# Patient Record
Sex: Female | Born: 1970 | Race: Black or African American | Hispanic: No | Marital: Single | Smoking: Current some day smoker
Health system: Southern US, Community
[De-identification: ages and names within clinical notes are randomized; demographics above are authoritative.]

## PROBLEM LIST (undated history)

## (undated) DIAGNOSIS — I10 Essential (primary) hypertension: Secondary | ICD-10-CM

## (undated) DIAGNOSIS — K219 Gastro-esophageal reflux disease without esophagitis: Secondary | ICD-10-CM

## (undated) DIAGNOSIS — D649 Anemia, unspecified: Secondary | ICD-10-CM

## (undated) DIAGNOSIS — R519 Headache, unspecified: Secondary | ICD-10-CM

## (undated) DIAGNOSIS — Z889 Allergy status to unspecified drugs, medicaments and biological substances status: Secondary | ICD-10-CM

## (undated) DIAGNOSIS — E785 Hyperlipidemia, unspecified: Secondary | ICD-10-CM

## (undated) DIAGNOSIS — J45909 Unspecified asthma, uncomplicated: Secondary | ICD-10-CM

## (undated) HISTORY — PX: WISDOM TOOTH EXTRACTION: SHX21

## (undated) HISTORY — PX: TUBAL LIGATION: SHX77

## (undated) HISTORY — DX: Hyperlipidemia, unspecified: E78.5

## (undated) HISTORY — PX: TONSILLECTOMY: SUR1361

---

## 1997-10-14 ENCOUNTER — Emergency Department (HOSPITAL_COMMUNITY): Admission: EM | Admit: 1997-10-14 | Discharge: 1997-10-14 | Payer: Self-pay | Admitting: Emergency Medicine

## 1999-10-02 ENCOUNTER — Emergency Department (HOSPITAL_COMMUNITY): Admission: EM | Admit: 1999-10-02 | Discharge: 1999-10-02 | Payer: Self-pay | Admitting: Emergency Medicine

## 2001-12-13 ENCOUNTER — Emergency Department (HOSPITAL_COMMUNITY): Admission: EM | Admit: 2001-12-13 | Discharge: 2001-12-13 | Payer: Self-pay | Admitting: Emergency Medicine

## 2001-12-13 ENCOUNTER — Encounter: Payer: Self-pay | Admitting: Emergency Medicine

## 2002-01-28 ENCOUNTER — Emergency Department (HOSPITAL_COMMUNITY): Admission: EM | Admit: 2002-01-28 | Discharge: 2002-01-29 | Payer: Self-pay | Admitting: Emergency Medicine

## 2002-01-28 ENCOUNTER — Encounter: Payer: Self-pay | Admitting: Emergency Medicine

## 2002-12-22 ENCOUNTER — Emergency Department (HOSPITAL_COMMUNITY): Admission: EM | Admit: 2002-12-22 | Discharge: 2002-12-22 | Payer: Self-pay

## 2003-02-07 ENCOUNTER — Emergency Department (HOSPITAL_COMMUNITY): Admission: EM | Admit: 2003-02-07 | Discharge: 2003-02-07 | Payer: Self-pay | Admitting: Emergency Medicine

## 2003-03-16 ENCOUNTER — Inpatient Hospital Stay (HOSPITAL_COMMUNITY): Admission: AD | Admit: 2003-03-16 | Discharge: 2003-03-16 | Payer: Self-pay | Admitting: *Deleted

## 2004-01-23 ENCOUNTER — Emergency Department (HOSPITAL_COMMUNITY): Admission: EM | Admit: 2004-01-23 | Discharge: 2004-01-23 | Payer: Self-pay | Admitting: Emergency Medicine

## 2005-08-17 ENCOUNTER — Emergency Department (HOSPITAL_COMMUNITY): Admission: EM | Admit: 2005-08-17 | Discharge: 2005-08-17 | Payer: Self-pay | Admitting: *Deleted

## 2006-05-17 ENCOUNTER — Emergency Department (HOSPITAL_COMMUNITY): Admission: EM | Admit: 2006-05-17 | Discharge: 2006-05-17 | Payer: Self-pay | Admitting: Emergency Medicine

## 2006-05-21 ENCOUNTER — Emergency Department (HOSPITAL_COMMUNITY): Admission: EM | Admit: 2006-05-21 | Discharge: 2006-05-21 | Payer: Self-pay | Admitting: Emergency Medicine

## 2006-07-13 ENCOUNTER — Emergency Department (HOSPITAL_COMMUNITY): Admission: EM | Admit: 2006-07-13 | Discharge: 2006-07-13 | Payer: Self-pay | Admitting: Emergency Medicine

## 2007-05-23 ENCOUNTER — Emergency Department (HOSPITAL_COMMUNITY): Admission: EM | Admit: 2007-05-23 | Discharge: 2007-05-23 | Payer: Self-pay | Admitting: Family Medicine

## 2009-06-24 ENCOUNTER — Inpatient Hospital Stay (HOSPITAL_COMMUNITY): Admission: AD | Admit: 2009-06-24 | Discharge: 2009-06-24 | Payer: Self-pay | Admitting: Obstetrics & Gynecology

## 2009-08-05 ENCOUNTER — Ambulatory Visit: Payer: Self-pay | Admitting: Obstetrics & Gynecology

## 2009-08-05 ENCOUNTER — Other Ambulatory Visit: Admission: RE | Admit: 2009-08-05 | Discharge: 2009-08-05 | Payer: Self-pay | Admitting: Obstetrics & Gynecology

## 2009-08-05 LAB — CONVERTED CEMR LAB
HCT: 29.9 % — ABNORMAL LOW (ref 36.0–46.0)
Hemoglobin: 7.9 g/dL — ABNORMAL LOW (ref 12.0–15.0)
MCHC: 26.4 g/dL — ABNORMAL LOW (ref 30.0–36.0)
MCV: 67.8 fL — ABNORMAL LOW (ref 78.0–100.0)
Platelets: 349 10*3/uL (ref 150–400)
RBC: 4.41 M/uL (ref 3.87–5.11)
RDW: 19.4 % — ABNORMAL HIGH (ref 11.5–15.5)
WBC: 8.4 10*3/uL (ref 4.0–10.5)

## 2009-08-26 ENCOUNTER — Ambulatory Visit: Payer: Self-pay | Admitting: Obstetrics and Gynecology

## 2009-11-10 ENCOUNTER — Ambulatory Visit: Payer: Self-pay | Admitting: Obstetrics & Gynecology

## 2009-11-11 ENCOUNTER — Encounter: Payer: Self-pay | Admitting: Obstetrics & Gynecology

## 2009-11-11 LAB — CONVERTED CEMR LAB
HCT: 27.1 % — ABNORMAL LOW (ref 36.0–46.0)
Hemoglobin: 7.4 g/dL — ABNORMAL LOW (ref 12.0–15.0)
MCHC: 27.3 g/dL — ABNORMAL LOW (ref 30.0–36.0)
MCV: 66.7 fL — ABNORMAL LOW (ref 78.0–100.0)
Platelets: 322 10*3/uL (ref 150–400)
RBC: 4.06 M/uL (ref 3.87–5.11)
RDW: 17.9 % — ABNORMAL HIGH (ref 11.5–15.5)
WBC: 6.6 10*3/uL (ref 4.0–10.5)

## 2009-12-21 ENCOUNTER — Ambulatory Visit: Payer: Self-pay | Admitting: Obstetrics and Gynecology

## 2009-12-21 LAB — CONVERTED CEMR LAB
HCT: 33.5 % — ABNORMAL LOW (ref 36.0–46.0)
Hemoglobin: 9.5 g/dL — ABNORMAL LOW (ref 12.0–15.0)
MCHC: 28.4 g/dL — ABNORMAL LOW (ref 30.0–36.0)
MCV: 71.4 fL — ABNORMAL LOW (ref 78.0–100.0)
Platelets: 320 10*3/uL (ref 150–400)
RBC: 4.69 M/uL (ref 3.87–5.11)
RDW: 24.1 % — ABNORMAL HIGH (ref 11.5–15.5)
WBC: 7.8 10*3/uL (ref 4.0–10.5)

## 2009-12-22 ENCOUNTER — Ambulatory Visit: Payer: Self-pay | Admitting: Obstetrics and Gynecology

## 2009-12-28 ENCOUNTER — Ambulatory Visit: Payer: Self-pay | Admitting: Obstetrics and Gynecology

## 2010-01-11 ENCOUNTER — Ambulatory Visit: Payer: Self-pay | Admitting: Obstetrics & Gynecology

## 2010-02-10 ENCOUNTER — Ambulatory Visit: Payer: Self-pay | Admitting: Obstetrics and Gynecology

## 2010-03-08 ENCOUNTER — Ambulatory Visit
Admission: RE | Admit: 2010-03-08 | Discharge: 2010-03-08 | Payer: Self-pay | Source: Home / Self Care | Attending: Obstetrics and Gynecology | Admitting: Obstetrics and Gynecology

## 2010-03-22 ENCOUNTER — Ambulatory Visit: Payer: Self-pay | Admitting: Obstetrics & Gynecology

## 2010-03-22 ENCOUNTER — Ambulatory Visit: Admit: 2010-03-22 | Payer: Self-pay | Admitting: Obstetrics and Gynecology

## 2010-05-07 LAB — POCT PREGNANCY, URINE: Preg Test, Ur: NEGATIVE

## 2010-05-09 LAB — CBC
HCT: 24.2 % — ABNORMAL LOW (ref 36.0–46.0)
Hemoglobin: 7.4 g/dL — ABNORMAL LOW (ref 12.0–15.0)
MCHC: 30.4 g/dL (ref 30.0–36.0)
MCV: 63.3 fL — ABNORMAL LOW (ref 78.0–100.0)
Platelets: 343 10*3/uL (ref 150–400)
RBC: 3.83 MIL/uL — ABNORMAL LOW (ref 3.87–5.11)
RDW: 18.3 % — ABNORMAL HIGH (ref 11.5–15.5)
WBC: 7.1 10*3/uL (ref 4.0–10.5)

## 2010-05-09 LAB — URINALYSIS, ROUTINE W REFLEX MICROSCOPIC
Bilirubin Urine: NEGATIVE
Glucose, UA: NEGATIVE mg/dL
Hgb urine dipstick: NEGATIVE
Ketones, ur: NEGATIVE mg/dL
Nitrite: NEGATIVE
Protein, ur: NEGATIVE mg/dL
Specific Gravity, Urine: >1.03 — ABNORMAL HIGH (ref 1.005–1.030)
Urobilinogen, UA: 0.2 mg/dL (ref 0.0–1.0)
pH: 6 (ref 5.0–8.0)

## 2010-05-09 LAB — WET PREP, GENITAL
Trich, Wet Prep: NONE SEEN
Yeast Wet Prep HPF POC: NONE SEEN

## 2010-05-09 LAB — POCT PREGNANCY, URINE: Preg Test, Ur: NEGATIVE

## 2010-05-09 LAB — GC/CHLAMYDIA PROBE AMP, GENITAL
Chlamydia, DNA Probe: NEGATIVE
GC Probe Amp, Genital: NEGATIVE

## 2010-06-08 ENCOUNTER — Other Ambulatory Visit: Payer: Self-pay | Admitting: Physician Assistant

## 2010-06-08 ENCOUNTER — Ambulatory Visit: Payer: Self-pay | Admitting: Physician Assistant

## 2010-06-08 DIAGNOSIS — N938 Other specified abnormal uterine and vaginal bleeding: Secondary | ICD-10-CM

## 2010-06-08 DIAGNOSIS — N925 Other specified irregular menstruation: Secondary | ICD-10-CM

## 2010-06-08 DIAGNOSIS — N949 Unspecified condition associated with female genital organs and menstrual cycle: Secondary | ICD-10-CM

## 2010-06-08 DIAGNOSIS — Z30430 Encounter for insertion of intrauterine contraceptive device: Secondary | ICD-10-CM

## 2010-06-08 LAB — POCT PREGNANCY, URINE: Preg Test, Ur: NEGATIVE

## 2010-06-09 NOTE — Group Therapy Note (Signed)
NAMESUMEYA, YONTZ              ACCOUNT NO.:  [id number]  MEDICAL RECORD NO.:  [id number]           PATIENT TYPE:  A  LOCATION:  [facility]                   FACILITY:  [facility]  PHYSICIAN:  Maylon Cos, CNM    DATE OF BIRTH:  1970-06-10  DATE OF SERVICE:  06/08/2010                                 CLINIC NOTE  REASON FOR TODAY'S VISIT:  Mirena IUD insertion.  HISTORY OF PRESENT ILLNESS:  The patient is a 40 year old single black female.  She is a gravida 7, para 4-0-3-3.  She presents today for Mirena IUD insertion for dysfunctional uterine bleeding after she was dissatisfied with Depo-Lupron.  After informed consent and time out, urine pregnancy test was negative.  The patient was placed in lithotomy position.  Graves speculum was used to visualize the paracervix.  Cervix was cleansed 3 times using Betadine solution.  Tenaculum was placed at 1 o'clock on the cervix and was used to measure an 8 cm uterine diameter. Mirena IUD was calibrated to the same and placed in the uterine cavity. Mirena IUD strings were trimmed to approximately 1 cm below the external os.  Tenaculum was removed from the cervix and bleeding was remained hemostasis using pressure with a Fox swab.  Remaining bleeding was cleared with Fox swab.  Speculum was removed from the vagina.  The patient tolerated the procedure well.  The patient was post-medicated with 100 mg of ibuprofen.  ASSESSMENT: 1. Dysfunctional uterine bleeding. 2. Mirena intrauterine contraceptive device placement successful     without complication.  FOLLOWUP PLAN:  The patient should follow up in 5-6 weeks for Mirena IUD string check or p.r.n. problems.          ______________________________ Maylon Cos, CNM    SS/MEDQ  D:  06/08/2010  T:  06/09/2010  Job:  [id number]

## 2010-07-07 ENCOUNTER — Ambulatory Visit: Payer: Self-pay | Admitting: Physician Assistant

## 2010-07-07 DIAGNOSIS — Z30431 Encounter for routine checking of intrauterine contraceptive device: Secondary | ICD-10-CM

## 2010-07-07 DIAGNOSIS — N939 Abnormal uterine and vaginal bleeding, unspecified: Secondary | ICD-10-CM

## 2010-07-07 DIAGNOSIS — D259 Leiomyoma of uterus, unspecified: Secondary | ICD-10-CM

## 2010-07-07 DIAGNOSIS — N926 Irregular menstruation, unspecified: Secondary | ICD-10-CM

## 2011-05-30 ENCOUNTER — Ambulatory Visit: Payer: Self-pay | Admitting: Physician Assistant

## 2011-08-22 ENCOUNTER — Emergency Department (HOSPITAL_COMMUNITY)
Admission: EM | Admit: 2011-08-22 | Discharge: 2011-08-23 | Disposition: A | Discharge status: Home / Self Care | Payer: Self-pay | Attending: Emergency Medicine | Admitting: Emergency Medicine

## 2011-08-22 ENCOUNTER — Emergency Department (HOSPITAL_COMMUNITY): Payer: Self-pay

## 2011-08-22 ENCOUNTER — Encounter (HOSPITAL_COMMUNITY): Payer: Self-pay | Admitting: *Deleted

## 2011-08-22 DIAGNOSIS — J45909 Unspecified asthma, uncomplicated: Secondary | ICD-10-CM | POA: Insufficient documentation

## 2011-08-22 DIAGNOSIS — R0602 Shortness of breath: Secondary | ICD-10-CM | POA: Insufficient documentation

## 2011-08-22 DIAGNOSIS — R110 Nausea: Secondary | ICD-10-CM | POA: Insufficient documentation

## 2011-08-22 DIAGNOSIS — K219 Gastro-esophageal reflux disease without esophagitis: Secondary | ICD-10-CM

## 2011-08-22 HISTORY — DX: Unspecified asthma, uncomplicated: J45.909

## 2011-08-22 MED ORDER — PANTOPRAZOLE SODIUM 40 MG PO TBEC
40.0000 mg | DELAYED_RELEASE_TABLET | Freq: Once | ORAL | Status: AC
  Administered 2011-08-22: 40 mg via ORAL
  Filled 2011-08-22: qty 1

## 2011-08-22 MED ORDER — GI COCKTAIL ~~LOC~~
30.0000 mL | Freq: Once | ORAL | Status: AC
  Administered 2011-08-22: 30 mL via ORAL
  Filled 2011-08-22: qty 30

## 2011-08-22 NOTE — ED Notes (Signed)
Pt st's she feels like something "is stuck" in between her shoulder blades.  Pt st's she can feel the pain in the front and the back, st's she doesn't have any chest pain but st's she has some SOB.  Pt st's the pain started one week ago Thursday.  Pt st's laying straight or bending over makes the pain better, st's the pain is constant.  Denies n/v, lightlessness, dizziness, blurred vision.  Pt st's she gets more SOB on exertion.

## 2011-08-23 LAB — CBC WITH DIFFERENTIAL/PLATELET
Basophils Absolute: 0 10*3/uL (ref 0.0–0.1)
Basophils Relative: 0 % (ref 0–1)
Eosinophils Absolute: 0.3 10*3/uL (ref 0.0–0.7)
Eosinophils Relative: 4 % (ref 0–5)
HCT: 37.5 % (ref 36.0–46.0)
Hemoglobin: 11.9 g/dL — ABNORMAL LOW (ref 12.0–15.0)
Lymphocytes Relative: 43 % (ref 12–46)
Lymphs Abs: 3.4 10*3/uL (ref 0.7–4.0)
MCH: 25.5 pg — ABNORMAL LOW (ref 26.0–34.0)
MCHC: 31.7 g/dL (ref 30.0–36.0)
MCV: 80.5 fL (ref 78.0–100.0)
Monocytes Absolute: 0.6 10*3/uL (ref 0.1–1.0)
Monocytes Relative: 7 % (ref 3–12)
Neutro Abs: 3.5 10*3/uL (ref 1.7–7.7)
Neutrophils Relative %: 46 % (ref 43–77)
Platelets: 287 10*3/uL (ref 150–400)
RBC: 4.66 MIL/uL (ref 3.87–5.11)
RDW: 17.6 % — ABNORMAL HIGH (ref 11.5–15.5)
WBC: 7.8 10*3/uL (ref 4.0–10.5)

## 2011-08-23 LAB — D-DIMER, QUANTITATIVE: D-Dimer, Quant: 0.28 ug/mL-FEU (ref 0.00–0.48)

## 2011-08-23 NOTE — ED Provider Notes (Signed)
History     CSN: [id number]  Arrival date & time 08/22/11  2244   First MD Initiated Contact with Patient 08/22/11 2316      Chief Complaint  Patient presents with  . Shortness of Breath    (Consider location/radiation/quality/duration/timing/severity/associated sxs/prior treatment) HPI Comments: Glenda Lee is a 41 y.o. Female who complains of a vague sensation of discomfort between her shoulder blades. It has been present for 1 week. In the last 24 hours. The discomfort. Has worsened and she has noticed that in the Center for chest with continued radiation to her back. She has nausea without vomiting. She denies fever, chills, cough, dizziness, weakness, or leg pain.  Patient is a 41 y.o. female presenting with shortness of breath. The history is provided by the patient.  Shortness of Breath  Associated symptoms include shortness of breath.    Past Medical History  Diagnosis Date  . Asthma     Past Surgical History  Procedure Date  . Tonsillectomy   . Abdominal hysterectomy     No family history on file.  History  Substance Use Topics  . Smoking status: Not on file  . Smokeless tobacco: Not on file  . Alcohol Use:      occasionally     OB History    Grav Para Term Preterm Abortions TAB SAB Ect Mult Living                  Review of Systems  Respiratory: Positive for shortness of breath.   All other systems reviewed and are negative.    Allergies  Review of patient's allergies indicates no known allergies.  Home Medications   Current Outpatient Rx  Name Route Sig Dispense Refill  . LEVONORGESTREL 20 MCG/24HR IU IUD Intrauterine 1 each by Intrauterine route once.      BP 125/85  Pulse 58  Temp 98.1 F (36.7 C) (Oral)  Resp 17  Ht 5' 7.5" (1.715 m)  Wt 265 lb (120.203 kg)  BMI 40.89 kg/m2  SpO2 100%  LMP 08/22/2011  Physical Exam  Nursing note and vitals reviewed. Constitutional: She is oriented to person, place, and time. She appears  well-developed and well-nourished.  HENT:  Head: Normocephalic and atraumatic.  Eyes: Conjunctivae and EOM are normal. Pupils are equal, round, and reactive to light.  Neck: Normal range of motion and phonation normal. Neck supple.  Cardiovascular: Normal rate, regular rhythm and intact distal pulses.   Pulmonary/Chest: Effort normal and breath sounds normal. She exhibits no tenderness.  Abdominal: Soft. She exhibits no distension. There is no tenderness. There is no guarding.  Musculoskeletal: Normal range of motion.  Neurological: She is alert and oriented to person, place, and time. She has normal strength. She exhibits normal muscle tone.  Skin: Skin is warm and dry.  Psychiatric: She has a normal mood and affect. Her behavior is normal. Judgment and thought content normal.    ED Course  Procedures (including critical care time)  Emergency department treatment: GI cocktail, and Protonix orally. At 01:50. She feels better.  Labs Reviewed  CBC WITH DIFFERENTIAL - Abnormal; Notable for the following:    Hemoglobin 11.9 (*)     MCH 25.5 (*)     RDW 17.6 (*)     All other components within normal limits  D-DIMER, QUANTITATIVE   Dg Chest 2 View  08/23/2011  *RADIOLOGY REPORT*  Clinical Data: Chest pain  CHEST - 2 VIEW  Comparison: 01/23/2004  Findings: Heart size upper  normal to mildly enlarged.  Mild central vascular fullness. Mild peribronchial thickening.  No other areas of consolidation. No pneumothorax or pleural effusion.  No acute osseous finding.  IMPRESSION: Nonspecific peribronchial thickening, can be seen in the setting of bronchitis or reactive airway disease.  Original Report Authenticated By: Waneta Martins, M.D.    Date: 08/23/2011  Rate: 56  Rhythm: sinus bradycardia  QRS Axis: normal  Intervals: normal  ST/T Wave abnormalities: nonspecific T wave changes  Conduction Disutrbances:none  Narrative Interpretation:   Old EKG Reviewed: unchanged   1. GERD  (gastroesophageal reflux disease)       MDM  Vague symptoms, with negative evaluation in ED. Symptoms are most likely consistent with GERD. Patient was treated with improvement in emergency department. The        Flint Melter, MD 08/23/11 [phone]

## 2012-12-27 ENCOUNTER — Emergency Department (HOSPITAL_COMMUNITY)
Admission: EM | Admit: 2012-12-27 | Discharge: 2012-12-27 | Disposition: A | Discharge status: Home / Self Care | Payer: Self-pay | Attending: Emergency Medicine | Admitting: Emergency Medicine

## 2012-12-27 ENCOUNTER — Emergency Department (HOSPITAL_COMMUNITY): Payer: Self-pay

## 2012-12-27 ENCOUNTER — Encounter (HOSPITAL_COMMUNITY): Payer: Self-pay | Admitting: Emergency Medicine

## 2012-12-27 DIAGNOSIS — Z975 Presence of (intrauterine) contraceptive device: Secondary | ICD-10-CM | POA: Insufficient documentation

## 2012-12-27 DIAGNOSIS — Z791 Long term (current) use of non-steroidal anti-inflammatories (NSAID): Secondary | ICD-10-CM | POA: Insufficient documentation

## 2012-12-27 DIAGNOSIS — M7021 Olecranon bursitis, right elbow: Secondary | ICD-10-CM

## 2012-12-27 DIAGNOSIS — J45909 Unspecified asthma, uncomplicated: Secondary | ICD-10-CM | POA: Insufficient documentation

## 2012-12-27 DIAGNOSIS — M7020 Olecranon bursitis, unspecified elbow: Secondary | ICD-10-CM | POA: Insufficient documentation

## 2012-12-27 MED ORDER — MELOXICAM 15 MG PO TABS: 15.0000 mg | ORAL_TABLET | Freq: Every day | ORAL | Status: DC

## 2012-12-27 NOTE — ED Provider Notes (Signed)
CSN: [id number]     Arrival date & time 12/27/12  0051 History   First MD Initiated Contact with Patient 12/27/12 0154     Chief Complaint  Patient presents with  . Extremity Pain   HPI  History provided by the patient. Patient is a 42 year old female with past history of asthma who presents with complaints of worsening and persistent right elbow pain and swelling. Patient first noticed some pain to her right elbow on Wednesday. Since that time she has had continued pain that is worsened in intensity. She also reports swelling over the posterior aspect of the elbow. She denies any injury or trauma. Denies having similar symptoms previously. No other history of joint pains or swelling. She denies any associated weakness or numbness in the hand. No fever, chills or sweats. Denies any IV drug use. Pain does not radiate. Patient did use over-the-counter Tiger balm and a pain patch to the skin without any improvement. No other aggravating or alleviating factors. No other associated symptoms.   Past Medical History  Diagnosis Date  . Asthma    Past Surgical History  Procedure Laterality Date  . Tonsillectomy    . Abdominal hysterectomy     History reviewed. No pertinent family history. History  Substance Use Topics  . Smoking status: Never Smoker   . Smokeless tobacco: Not on file  . Alcohol Use: Yes     Comment: occasionally    OB History   Grav Para Term Preterm Abortions TAB SAB Ect Mult Living                 Review of Systems  Constitutional: Negative for fever, chills and diaphoresis.  Neurological: Negative for weakness and numbness.  All other systems reviewed and are negative.    Allergies  Review of patient's allergies indicates no known allergies.  Home Medications   Current Outpatient Rx  Name  Route  Sig  Dispense  Refill  . Camphor-Menthol (TIGER BALM EXTRA STRENGTH) 11-10 % OINT   Apply externally   Apply 1 application topically as needed (pain).          Marland Kitchen ibuprofen (ADVIL,MOTRIN) 200 MG tablet   Oral   Take 400 mg by mouth every 6 (six) hours as needed (pain).         Marland Kitchen levonorgestrel (MIRENA) 20 MCG/24HR IUD   Intrauterine   1 each by Intrauterine route once.         . meloxicam (MOBIC) 15 MG tablet   Oral   Take 1 tablet (15 mg total) by mouth daily.   20 tablet   0    BP 141/77  Pulse 64  Temp(Src) 98.1 F (36.7 C) (Oral)  Resp 18  SpO2 97%  LMP 12/06/2012 Physical Exam  Nursing note and vitals reviewed. Constitutional: She is oriented to person, place, and time. She appears well-developed and well-nourished. No distress.  HENT:  Head: Normocephalic.  Cardiovascular: Normal rate and regular rhythm.   Pulmonary/Chest: Effort normal and breath sounds normal. No respiratory distress. She has no wheezes. She has no rales.  Musculoskeletal:  Slightly reduced range of motion of the right elbow with greatest pain at extreme flexion. There is swelling and tenderness over the olecranon bursa. No deformity of the bones. Normal distal strength and pulses. Normal sensations to touch.  Neurological: She is alert and oriented to person, place, and time.  Skin: Skin is warm and dry. No rash noted.  Psychiatric: She has a normal mood and  affect. Her behavior is normal.    ED Course  Procedures    COORDINATION OF CARE:  Nursing notes reviewed. Vital signs reviewed. Initial pt interview and examination performed.   3:09 AM-patient seen and evaluated. Patient appears well. She does not request any pain medication at this time. X-rays reviewed without signs of acute injury. Exam finding consistent with bursitis. Discussed treatment plan with pt at bedside, which includes rest, ice, compression and elevation. Pt agrees with plan.   Initial diagnostic testing ordered.     Imaging Review Dg Elbow 2 Views Right  12/27/2012   CLINICAL DATA:  Right elbow pain for 3 days.  EXAM: RIGHT ELBOW - 2 VIEW  COMPARISON:  None.  FINDINGS:  There is no evidence of fracture, dislocation, or joint effusion. There is no evidence of arthropathy. An apparent enthesophyte is noted at the olecranon. Soft tissues are unremarkable.  IMPRESSION: No evidence of fracture or dislocation.   Electronically Signed   By: Roanna Raider M.D.   On: 12/27/2012 02:25     MDM   1. Olecranon bursitis of right elbow        Angus Seller, PA-C 12/27/12 [phone]

## 2012-12-27 NOTE — ED Notes (Signed)
Pt arrived to ED with a complaint of elbow pain.  Pt states pain started from an unknown source on Wednesday.  Pt states the pain awoke her tonight and she felt the need to have it checked

## 2012-12-28 NOTE — ED Provider Notes (Signed)
Medical screening examination/treatment/procedure(s) were performed by non-physician practitioner and as supervising physician I was immediately available for consultation/collaboration.   Loren Racer, MD 12/28/12 [phone]

## 2014-10-15 ENCOUNTER — Encounter (HOSPITAL_COMMUNITY): Payer: Self-pay | Admitting: Emergency Medicine

## 2014-10-15 ENCOUNTER — Emergency Department (HOSPITAL_COMMUNITY)
Admission: EM | Admit: 2014-10-15 | Discharge: 2014-10-15 | Disposition: A | Discharge status: Home / Self Care | Payer: Self-pay | Attending: Emergency Medicine | Admitting: Emergency Medicine

## 2014-10-15 ENCOUNTER — Emergency Department (EMERGENCY_DEPARTMENT_HOSPITAL)
Admit: 2014-10-15 | Discharge: 2014-10-15 | Disposition: A | Discharge status: Home / Self Care | Payer: Self-pay | Attending: Emergency Medicine | Admitting: Emergency Medicine

## 2014-10-15 ENCOUNTER — Emergency Department (HOSPITAL_COMMUNITY): Payer: Self-pay

## 2014-10-15 DIAGNOSIS — M25551 Pain in right hip: Secondary | ICD-10-CM | POA: Insufficient documentation

## 2014-10-15 DIAGNOSIS — M549 Dorsalgia, unspecified: Secondary | ICD-10-CM | POA: Insufficient documentation

## 2014-10-15 DIAGNOSIS — J45909 Unspecified asthma, uncomplicated: Secondary | ICD-10-CM | POA: Insufficient documentation

## 2014-10-15 DIAGNOSIS — Z793 Long term (current) use of hormonal contraceptives: Secondary | ICD-10-CM | POA: Insufficient documentation

## 2014-10-15 DIAGNOSIS — M25561 Pain in right knee: Secondary | ICD-10-CM | POA: Insufficient documentation

## 2014-10-15 DIAGNOSIS — M79604 Pain in right leg: Secondary | ICD-10-CM

## 2014-10-15 DIAGNOSIS — R2241 Localized swelling, mass and lump, right lower limb: Secondary | ICD-10-CM | POA: Insufficient documentation

## 2014-10-15 DIAGNOSIS — Z791 Long term (current) use of non-steroidal anti-inflammatories (NSAID): Secondary | ICD-10-CM | POA: Insufficient documentation

## 2014-10-15 LAB — CBC WITH DIFFERENTIAL/PLATELET
Basophils Absolute: 0 10*3/uL (ref 0.0–0.1)
Basophils Relative: 0 % (ref 0–1)
Eosinophils Absolute: 0.3 10*3/uL (ref 0.0–0.7)
Eosinophils Relative: 4 % (ref 0–5)
HCT: 41.4 % (ref 36.0–46.0)
Hemoglobin: 13.7 g/dL (ref 12.0–15.0)
Lymphocytes Relative: 42 % (ref 12–46)
Lymphs Abs: 3.1 10*3/uL (ref 0.7–4.0)
MCH: 29.6 pg (ref 26.0–34.0)
MCHC: 33.1 g/dL (ref 30.0–36.0)
MCV: 89.4 fL (ref 78.0–100.0)
Monocytes Absolute: 0.7 10*3/uL (ref 0.1–1.0)
Monocytes Relative: 9 % (ref 3–12)
Neutro Abs: 3.3 10*3/uL (ref 1.7–7.7)
Neutrophils Relative %: 45 % (ref 43–77)
Platelets: 214 10*3/uL (ref 150–400)
RBC: 4.63 MIL/uL (ref 3.87–5.11)
RDW: 13.8 % (ref 11.5–15.5)
WBC: 7.4 10*3/uL (ref 4.0–10.5)

## 2014-10-15 LAB — BASIC METABOLIC PANEL
Anion gap: 7 (ref 5–15)
BUN: 8 mg/dL (ref 6–20)
CO2: 23 mmol/L (ref 22–32)
Calcium: 8.5 mg/dL — ABNORMAL LOW (ref 8.9–10.3)
Chloride: 105 mmol/L (ref 101–111)
Creatinine, Ser: 0.81 mg/dL (ref 0.44–1.00)
GFR calc Af Amer: >60 mL/min (ref >60)
GFR calc non Af Amer: >60 mL/min (ref >60)
Glucose, Bld: 96 mg/dL (ref 65–99)
Potassium: 3.9 mmol/L (ref 3.5–5.1)
Sodium: 135 mmol/L (ref 135–145)

## 2014-10-15 LAB — PROTIME-INR
INR: 1.02 (ref 0.00–1.49)
Prothrombin Time: 13.6 seconds (ref 11.6–15.2)

## 2014-10-15 MED ORDER — NAPROXEN 500 MG PO TABS: 500.0000 mg | ORAL_TABLET | Freq: Two times a day (BID) | ORAL | Status: DC

## 2014-10-15 MED ORDER — KETOROLAC TROMETHAMINE 60 MG/2ML IM SOLN
60.0000 mg | Freq: Once | INTRAMUSCULAR | Status: AC
  Administered 2014-10-15: 60 mg via INTRAMUSCULAR
  Filled 2014-10-15: qty 2

## 2014-10-15 NOTE — Discharge Instructions (Signed)

## 2014-10-15 NOTE — Progress Notes (Signed)
*  PRELIMINARY RESULTS* Vascular Ultrasound Right lower extremity venous duplex has been completed.  Preliminary findings: negative for DVT. Enlarged right inguinal lymph node noted.  Landry Mellow, RDMS, RVT  10/15/2014, 11:09 AM

## 2014-10-15 NOTE — ED Provider Notes (Signed)
CSN: [id number]     Arrival date & time 10/15/14  8341 History   First MD Initiated Contact with Patient 10/15/14 [phone]     Chief Complaint  Patient presents with  . Leg Pain     (Consider location/radiation/quality/duration/timing/severity/associated sxs/prior Treatment) Patient is a 44 y.o. female presenting with leg pain.  Leg Pain Location:  Hip, leg and knee Time since incident:  3 days Hip location:  R hip Leg location:  R lower leg Knee location:  R knee Pain details:    Quality:  Aching   Severity:  Severe   Onset quality:  Gradual   Duration:  3 days   Timing:  Constant   Progression:  Waxing and waning Chronicity:  New Dislocation: no   Prior injury to area:  No Relieved by:  Nothing Worsened by:  Activity and bearing weight Ineffective treatments: goodys. Associated symptoms: back pain (at times) and swelling (around right knee)   Associated symptoms: no fever and no neck pain     Past Medical History  Diagnosis Date  . Asthma    Past Surgical History  Procedure Laterality Date  . Tonsillectomy    . Tubal ligation     No family history on file. Social History  Substance Use Topics  . Smoking status: Never Smoker   . Smokeless tobacco: None  . Alcohol Use: Yes     Comment: occasionally    OB History    No data available     Review of Systems  Constitutional: Negative for fever.  HENT: Negative for sore throat.   Eyes: Negative for visual disturbance.  Respiratory: Negative for cough and shortness of breath.   Cardiovascular: Negative for chest pain.  Gastrointestinal: Negative for nausea and abdominal pain.  Genitourinary: Negative for difficulty urinating (no loss of control of bowel or bladder).  Musculoskeletal: Positive for myalgias (right calf), back pain (at times) and joint swelling (feels right knee swollen around knee). Negative for neck pain.  Skin: Negative for rash.  Neurological: Negative for syncope and headaches.       Allergies  Review of patient's allergies indicates no known allergies.  Home Medications   Prior to Admission medications   Medication Sig Start Date End Date Taking? Authorizing Provider  Camphor-Menthol (TIGER BALM EXTRA STRENGTH) 11-10 % OINT Apply 1 application topically as needed (pain).    Historical Provider, MD  ibuprofen (ADVIL,MOTRIN) 200 MG tablet Take 400 mg by mouth every 6 (six) hours as needed (pain).    Historical Provider, MD  levonorgestrel (MIRENA) 20 MCG/24HR IUD 1 each by Intrauterine route once.    Historical Provider, MD  meloxicam (MOBIC) 15 MG tablet Take 1 tablet (15 mg total) by mouth daily. 12/27/12   Peter Dammen, PA-C   BP 142/79 mmHg  Pulse 78  Temp(Src) 98.7 F (37.1 C) (Oral)  Resp 22  Ht 5\' 7"  (1.702 m)  Wt 300 lb (136.079 kg)  BMI 46.98 kg/m2  SpO2 99%  LMP 10/08/2014 Physical Exam  Constitutional: She is oriented to person, place, and time. She appears well-developed and well-nourished. No distress.  HENT:  Head: Normocephalic and atraumatic.  Eyes: Conjunctivae and EOM are normal.  Neck: Normal range of motion.  Cardiovascular: Normal rate, regular rhythm, normal heart sounds and intact distal pulses.  Exam reveals no gallop and no friction rub.   No murmur heard. Pulmonary/Chest: Effort normal and breath sounds normal. No respiratory distress. She has no wheezes. She has no rales.  Abdominal: Soft.  She exhibits no distension. There is no tenderness. There is no guarding.  Musculoskeletal: She exhibits no edema.       Right hip: She exhibits normal range of motion and no laceration.       Right knee: She exhibits normal range of motion, no swelling and no effusion. Tenderness (superior and medial) found.       Left knee: She exhibits normal range of motion, no swelling and no effusion.  Neurological: She is alert and oriented to person, place, and time.  Skin: Skin is warm and dry. No rash noted. She is not diaphoretic. No erythema.   Nursing note and vitals reviewed.   ED Course  Procedures (including critical care time) Labs Review Labs Reviewed - No data to display  Imaging Review No results found. I have personally reviewed and evaluated these images and lab results as part of my medical decision-making.   EKG Interpretation None      MDM   Final diagnoses:  None   44yo female with history of asthma presents with concern of right leg pain.  Patient without erythema, no fever, full range of motion of knee and hip joint and have low suspicion for septic arthritis.  No history of trauma to suggest fracture or acute ligamentous injury.  XR of knee and hip showed no abnormalities. DVT ultrasound done of the right lower extremity given calf pain showed no sign of DVT.  Patient's pain was improved after Toradol.  Given pain in multiple joints recommended PCP follow-up. Wrote rx for naproxen and recommended calling [facility] to obtain PCP. Patient discharged in stable condition with understanding of reasons to return.      Gareth Morgan, MD 10/16/14 [phone]

## 2014-10-15 NOTE — ED Notes (Signed)
Patient transported to vascular. 

## 2014-10-15 NOTE — ED Notes (Signed)
Pt c/o right leg pain x 3 days. Pt reports pain relieved with Goody powders. Pt reports some swelling around right knee area, pain to right calf and right hip. Pain increases with ambulation.

## 2015-02-14 ENCOUNTER — Encounter (HOSPITAL_COMMUNITY): Payer: Self-pay

## 2015-02-14 ENCOUNTER — Emergency Department (HOSPITAL_COMMUNITY)
Admission: EM | Admit: 2015-02-14 | Discharge: 2015-02-14 | Disposition: A | Discharge status: Home / Self Care | Payer: Self-pay | Attending: Emergency Medicine | Admitting: Emergency Medicine

## 2015-02-14 DIAGNOSIS — M79642 Pain in left hand: Secondary | ICD-10-CM | POA: Insufficient documentation

## 2015-02-14 DIAGNOSIS — M25531 Pain in right wrist: Secondary | ICD-10-CM | POA: Insufficient documentation

## 2015-02-14 DIAGNOSIS — Z791 Long term (current) use of non-steroidal anti-inflammatories (NSAID): Secondary | ICD-10-CM | POA: Insufficient documentation

## 2015-02-14 DIAGNOSIS — R202 Paresthesia of skin: Secondary | ICD-10-CM | POA: Insufficient documentation

## 2015-02-14 DIAGNOSIS — J45909 Unspecified asthma, uncomplicated: Secondary | ICD-10-CM | POA: Insufficient documentation

## 2015-02-14 DIAGNOSIS — R200 Anesthesia of skin: Secondary | ICD-10-CM | POA: Insufficient documentation

## 2015-02-14 DIAGNOSIS — E669 Obesity, unspecified: Secondary | ICD-10-CM | POA: Insufficient documentation

## 2015-02-14 DIAGNOSIS — M25532 Pain in left wrist: Secondary | ICD-10-CM | POA: Insufficient documentation

## 2015-02-14 DIAGNOSIS — M79641 Pain in right hand: Secondary | ICD-10-CM | POA: Insufficient documentation

## 2015-02-14 LAB — CBG MONITORING, ED: Glucose-Capillary: 104 mg/dL — ABNORMAL HIGH (ref 65–99)

## 2015-02-14 MED ORDER — NAPROXEN 500 MG PO TABS: 500.0000 mg | ORAL_TABLET | Freq: Two times a day (BID) | ORAL | Status: DC | PRN

## 2015-02-14 NOTE — Discharge Instructions (Signed)
Your hand numbness and pain is likely due to repetitive work.  Please wear wrist splint for support.  Follow instruction below.  Return if you are having trouble with hand weakness, dropping objects, severe headache or neck pain.    Carpal Tunnel Syndrome Carpal tunnel syndrome is a condition that causes pain in your hand and arm. The carpal tunnel is a narrow area that is on the palm side of your wrist. Repeated wrist motion or certain diseases may cause swelling in the tunnel. This swelling can pinch the main nerve in the wrist (median nerve).  HOME CARE If You Have a Splint:  Wear it as told by your doctor. Remove it only as told by your doctor.  Loosen the splint if your fingers:  Become numb and tingle.  Turn blue and cold.  Keep the splint clean and dry. General Instructions  Take over-the-counter and prescription medicines only as told by your doctor.  Rest your wrist from any activity that may be causing your pain. If needed, talk to your employer about changes that can be made in your work, such as getting a wrist pad to use while typing.  If directed, apply ice to the painful area:  Put ice in a plastic bag.  Place a towel between your skin and the bag.  Leave the ice on for 20 minutes, 2-3 times per day.  Keep all follow-up visits as told by your doctor. This is important.  Do any exercises as told by your doctor, physical therapist, or occupational therapist. GET HELP IF:  You have new symptoms.  Medicine does not help your pain.  Your symptoms get worse.   This information is not intended to replace advice given to you by your health care provider. Make sure you discuss any questions you have with your health care provider.   Document Released: 01/25/2011 Document Revised: 10/27/2014 Document Reviewed: 06/23/2014 Elsevier Interactive Patient Education [employer].

## 2015-02-14 NOTE — ED Provider Notes (Signed)
CSN: [id number]     Arrival date & time 02/14/15  [id number] History   First MD Initiated Contact with Patient 02/14/15 1000     No chief complaint on file.    (Consider location/radiation/quality/duration/timing/severity/associated sxs/prior Treatment) HPI   44 year old female with history of asthma presents for evaluation of hand numbness. 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 She also recently moved from the house to a different location and has been doing a lot of packing and moving for the past week. She endorses occasional tingling sensation to the tips of her fingers on both hands for the past week. For the past 3 days she also complaining of achy cramping pain to her hand and wrist which woke up this morning due to the pain. After shaking her hands the pain and tingling sensation seems to improve. She denies having any headache, neck pain, shoulder pain, neck stiffness, weakness, or dropping objects. She is right-hand dominant. No specific treatment tried. No history of diabetes. No history of MS. No vision changes. She does report that her mom and dad has history of diabetes. She denies any tingling or numbness sensation to her feet.  Past Medical History  Diagnosis Date  . Asthma    Past Surgical History  Procedure Laterality Date  . Tonsillectomy    . Tubal ligation     No family history on file. Social History  Substance Use Topics  . Smoking status: Never Smoker   . Smokeless tobacco: Not on file  . Alcohol Use: Yes     Comment: occasionally    OB History    No data available     Review of Systems  All other systems reviewed and are negative.     Allergies  Review of patient's allergies indicates no known allergies.  Home Medications   Prior to Admission medications   Medication Sig Start Date End Date Taking? Authorizing Provider  Aspirin-Acetaminophen-Caffeine (GOODY HEADACHE PO)  Take 1 packet by mouth every 4 (four) hours as needed (pain).    Historical Provider, MD  levonorgestrel (MIRENA) 20 MCG/24HR IUD 1 each by Intrauterine route once.    Historical Provider, MD  meloxicam (MOBIC) 15 MG tablet Take 1 tablet (15 mg total) by mouth daily. 12/27/12   Hazel Sams, PA-C  naproxen (NAPROSYN) 500 MG tablet Take 1 tablet (500 mg total) by mouth 2 (two) times daily. 10/15/14   Gareth Morgan, MD   There were no vitals taken for this visit. Physical Exam  Constitutional: She appears well-developed and well-nourished. No distress.  Obese African-American American female appears to be in no acute discomfort  HENT:  Head: Atraumatic.  Eyes: Conjunctivae are normal.  Neck: Normal range of motion. Neck supple.  Neck with full range of motion, no tenderness.  Cardiovascular: Normal rate and regular rhythm.   Musculoskeletal:  Normal grip strength bilaterally.     Neurological: She is alert.  Sensation is intact throughout bilateral upper extremities with soft and sharp object. No weakness noted. Radial pulse 2+ bilaterally. Skin with normal tone and normal skin color.  Skin: No rash noted.  Psychiatric: She has a normal mood and affect.  Nursing note and vitals reviewed.   ED Course  Procedures (including critical care time) Labs Review Labs Reviewed - No data to display  Imaging Review No results found. I have personally reviewed and evaluated these images and lab results as part of my  medical decision-making.   EKG Interpretation None      MDM   Final diagnoses:  Paresthesia of hand, bilateral    BP 97/70 mmHg  Pulse 54  Temp(Src) 98.4 F (36.9 C) (Oral)  Resp 17  SpO2 98%  LMP 02/04/2015   10:23 AM Patient with intermittent paresthesia to the tips of her fingers on both hands along with hand pain and wrist pain. This is likely secondary to recent repetitive activities from her new job as well as recent activities moving to a new place. I have  low suspicion for MS, cervical radiculopathy, or other acute emergent condition. No prior history of diabetes, however we'll check a CBG per patient's request. Symptoms may suggest early signs of carpal tunnel syndrome versus repetitive activities. I encouraged patient to wear a wrist brace for support which she can obtain at [facility] for a more affordable price. I have low suspicion for stroke causing her symptoms especially since pt has bilateral hand paresthesia.    Domenic Moras, PA-C 02/14/15 1036  Dorie Rank, MD 02/14/15 [phone]

## 2015-02-14 NOTE — ED Notes (Signed)
Pt. Presents with complaint of intermittent bilateral hand numbness/tingling. Pt. States it feels like pins and needles, pt. States it started around Friday, she has been moving recently. Denies weakness, states she was woken up from her sleep from pain this morning.

## 2015-07-02 ENCOUNTER — Emergency Department (HOSPITAL_COMMUNITY)
Admission: EM | Admit: 2015-07-02 | Discharge: 2015-07-02 | Disposition: A | Discharge status: Home / Self Care | Payer: Self-pay | Attending: Emergency Medicine | Admitting: Emergency Medicine

## 2015-07-02 ENCOUNTER — Emergency Department (HOSPITAL_COMMUNITY): Payer: Self-pay

## 2015-07-02 ENCOUNTER — Encounter (HOSPITAL_COMMUNITY): Payer: Self-pay | Admitting: *Deleted

## 2015-07-02 DIAGNOSIS — Z7982 Long term (current) use of aspirin: Secondary | ICD-10-CM | POA: Insufficient documentation

## 2015-07-02 DIAGNOSIS — R1013 Epigastric pain: Secondary | ICD-10-CM | POA: Insufficient documentation

## 2015-07-02 DIAGNOSIS — J45909 Unspecified asthma, uncomplicated: Secondary | ICD-10-CM | POA: Insufficient documentation

## 2015-07-02 LAB — CBC
HCT: 41.7 % (ref 36.0–46.0)
Hemoglobin: 13.6 g/dL (ref 12.0–15.0)
MCH: 29 pg (ref 26.0–34.0)
MCHC: 32.6 g/dL (ref 30.0–36.0)
MCV: 88.9 fL (ref 78.0–100.0)
Platelets: 211 10*3/uL (ref 150–400)
RBC: 4.69 MIL/uL (ref 3.87–5.11)
RDW: 13.8 % (ref 11.5–15.5)
WBC: 7.7 10*3/uL (ref 4.0–10.5)

## 2015-07-02 LAB — BASIC METABOLIC PANEL
Anion gap: 7 (ref 5–15)
BUN: 12 mg/dL (ref 6–20)
CO2: 23 mmol/L (ref 22–32)
Calcium: 8.8 mg/dL — ABNORMAL LOW (ref 8.9–10.3)
Chloride: 110 mmol/L (ref 101–111)
Creatinine, Ser: 0.84 mg/dL (ref 0.44–1.00)
GFR calc Af Amer: >60 mL/min (ref >60)
GFR calc non Af Amer: >60 mL/min (ref >60)
Glucose, Bld: 136 mg/dL — ABNORMAL HIGH (ref 65–99)
Potassium: 3.9 mmol/L (ref 3.5–5.1)
Sodium: 140 mmol/L (ref 135–145)

## 2015-07-02 LAB — I-STAT TROPONIN, ED: Troponin i, poc: 0.01 ng/mL (ref 0.00–0.08)

## 2015-07-02 MED ORDER — OMEPRAZOLE 20 MG PO CPDR: DELAYED_RELEASE_CAPSULE | ORAL | Status: DC

## 2015-07-02 MED ORDER — GI COCKTAIL ~~LOC~~
30.0000 mL | Freq: Once | ORAL | Status: AC
  Administered 2015-07-02: 30 mL via ORAL
  Filled 2015-07-02: qty 30

## 2015-07-02 NOTE — ED Notes (Signed)
Patient transported to X-ray 

## 2015-07-02 NOTE — Discharge Instructions (Signed)
Gastroesophageal Reflux Disease, Adult Normally, food travels down the esophagus and stays in the stomach to be digested. However, when a person has gastroesophageal reflux disease (GERD), food and stomach acid move back up into the esophagus. When this happens, the esophagus becomes sore and inflamed. Over time, GERD can create small holes (ulcers) in the lining of the esophagus.  CAUSES This condition is caused by a problem with the muscle between the esophagus and the stomach (lower esophageal sphincter, or LES). Normally, the LES muscle closes after food passes through the esophagus to the stomach. When the LES is weakened or abnormal, it does not close properly, and that allows food and stomach acid to go back up into the esophagus. The LES can be weakened by certain dietary substances, medicines, and medical conditions, including:  Tobacco use.  Pregnancy.  Having a hiatal hernia.  Heavy alcohol use.  Certain foods and beverages, such as coffee, chocolate, onions, and peppermint. RISK FACTORS This condition is more likely to develop in:  People who have an increased body weight.  People who have connective tissue disorders.  People who use NSAID medicines. SYMPTOMS Symptoms of this condition include:  Heartburn.  Difficult or painful swallowing.  The feeling of having a lump in the throat.  Abitter taste in the mouth.  Bad breath.  Having a large amount of saliva.  Having an upset or bloated stomach.  Belching.  Chest pain.  Shortness of breath or wheezing.  Ongoing (chronic) cough or a night-time cough.  Wearing away of tooth enamel.  Weight loss. Different conditions can cause chest pain. Make sure to see your health care provider if you experience chest pain. DIAGNOSIS Your health care provider will take a medical history and perform a physical exam. To determine if you have mild or severe GERD, your health care provider may also monitor how you respond  to treatment. You may also have other tests, including:  An endoscopy toexamine your stomach and esophagus with a small camera.  A test thatmeasures the acidity level in your esophagus.  A test thatmeasures how much pressure is on your esophagus.  A barium swallow or modified barium swallow to show the shape, size, and functioning of your esophagus. TREATMENT The goal of treatment is to help relieve your symptoms and to prevent complications. Treatment for this condition may vary depending on how severe your symptoms are. Your health care provider may recommend:  Changes to your diet.  Medicine.  Surgery. HOME CARE INSTRUCTIONS Diet  Follow a diet as recommended by your health care provider. This may involve avoiding foods and drinks such as:  Coffee and tea (with or without caffeine).  Drinks that containalcohol.  Energy drinks and sports drinks.  Carbonated drinks or sodas.  Chocolate and cocoa.  Peppermint and mint flavorings.  Garlic and onions.  Horseradish.  Spicy and acidic foods, including peppers, chili powder, curry powder, vinegar, hot sauces, and barbecue sauce.  Citrus fruit juices and citrus fruits, such as oranges, lemons, and limes.  Tomato-based foods, such as red sauce, chili, salsa, and pizza with red sauce.  Fried and fatty foods, such as donuts, french fries, potato chips, and high-fat dressings.  High-fat meats, such as hot dogs and fatty cuts of red and white meats, such as rib eye steak, sausage, ham, and bacon.  High-fat dairy items, such as whole milk, butter, and cream cheese.  Eat small, frequent meals instead of large meals.  Avoid drinking large amounts of liquid with your  meals.  Avoid eating meals during the 2-3 hours before bedtime.  Avoid lying down right after you eat.  Do not exercise right after you eat. General Instructions  Pay attention to any changes in your symptoms.  Take over-the-counter and prescription  medicines only as told by your health care provider. Do not take aspirin, ibuprofen, or other NSAIDs unless your health care provider told you to do so.  Do not use any tobacco products, including cigarettes, chewing tobacco, and e-cigarettes. If you need help quitting, ask your health care provider.  Wear loose-fitting clothing. Do not wear anything tight around your waist that causes pressure on your abdomen.  Raise (elevate) the head of your bed 6 inches (15cm).  Try to reduce your stress, such as with yoga or meditation. If you need help reducing stress, ask your health care provider.  If you are overweight, reduce your weight to an amount that is healthy for you. Ask your health care provider for guidance about a safe weight loss goal.  Keep all follow-up visits as told by your health care provider. This is important. SEEK MEDICAL CARE IF:  You have new symptoms.  You have unexplained weight loss.  You have difficulty swallowing, or it hurts to swallow.  You have wheezing or a persistent cough.  Your symptoms do not improve with treatment.  You have a hoarse voice. SEEK IMMEDIATE MEDICAL CARE IF:  You have pain in your arms, neck, jaw, teeth, or back.  You feel sweaty, dizzy, or light-headed.  You have chest pain or shortness of breath.  You vomit and your vomit looks like blood or coffee grounds.  You faint.  Your stool is bloody or black.  You cannot swallow, drink, or eat.   This information is not intended to replace advice given to you by your health care provider. Make sure you discuss any questions you have with your health care provider.   Document Released: 11/15/2004 Document Revised: 10/27/2014 Document Reviewed: 06/02/2014 Elsevier Interactive Patient Education 2016 [place] for Gastroesophageal Reflux Disease, Adult When you have gastroesophageal reflux disease (GERD), the foods you eat and your eating habits are very important.  Choosing the right foods can help ease the discomfort of GERD. WHAT GENERAL GUIDELINES DO I NEED TO FOLLOW?  Choose fruits, vegetables, whole grains, low-fat dairy products, and low-fat meat, fish, and poultry.  Limit fats such as oils, salad dressings, butter, nuts, and avocado.  Keep a food diary to identify foods that cause symptoms.  Avoid foods that cause reflux. These may be different for different people.  Eat frequent small meals instead of three large meals each day.  Eat your meals slowly, in a relaxed setting.  Limit fried foods.  Cook foods using methods other than frying.  Avoid drinking alcohol.  Avoid drinking large amounts of liquids with your meals.  Avoid bending over or lying down until 2-3 hours after eating. WHAT FOODS ARE NOT RECOMMENDED? The following are some foods and drinks that may worsen your symptoms: Vegetables Tomatoes. Tomato juice. Tomato and spaghetti sauce. Chili peppers. Onion and garlic. Horseradish. Fruits Oranges, grapefruit, and lemon (fruit and juice). Meats High-fat meats, fish, and poultry. This includes hot dogs, ribs, ham, sausage, salami, and bacon. Dairy Whole milk and chocolate milk. Sour cream. Cream. Butter. Ice cream. Cream cheese.  Beverages Coffee and tea, with or without caffeine. Carbonated beverages or energy drinks. Condiments Hot sauce. Barbecue sauce.  Sweets/Desserts Chocolate and cocoa. Donuts. Peppermint and spearmint.  Fats and Oils High-fat foods, including [place] fries and potato chips. Other Vinegar. Strong spices, such as black pepper, white pepper, red pepper, cayenne, curry powder, cloves, ginger, and chili powder. The items listed above may not be a complete list of foods and beverages to avoid. Contact your dietitian for more information.   This information is not intended to replace advice given to you by your health care provider. Make sure you discuss any questions you have with your health care  provider.   Document Released: 02/05/2005 Document Revised: 02/26/2014 Document Reviewed: 12/10/2012 Elsevier Interactive Patient Education [employer].

## 2015-07-02 NOTE — ED Provider Notes (Signed)
CSN: [id number]     Arrival date & time 07/02/15  0107 History   First MD Initiated Contact with Patient 07/02/15 [phone]     Chief Complaint  Patient presents with  . Chest Pain     (Consider location/radiation/quality/duration/timing/severity/associated sxs/prior Treatment) HPI Comments: Patient presents with progressively worsening chest pain that started yesterday and has been constant since onset. She has tried taking medication for acid reflux (can't remember the name) without relief. She describes the pain as burning, radiating into the upper back, worse with deep breath but denies SOB. No cough or fever. No nausea or vomiting. Pain is unaffected by eating or drinking. She states it is similar to pain she had with previous diagnosis of reflux.   Patient is a 45 y.o. female presenting with chest pain. The history is provided by the patient. No language interpreter was used.  Chest Pain Pain location:  Substernal area Pain quality: aching, burning, radiating and sharp   Pain radiates to:  Upper back Pain radiates to the back: yes   Pain severity:  Moderate Onset quality:  Gradual Duration:  1 day Associated symptoms: back pain   Associated symptoms: no abdominal pain, no dysphagia, no fever, no nausea, no shortness of breath and not vomiting     Past Medical History  Diagnosis Date  . Asthma    Past Surgical History  Procedure Laterality Date  . Tonsillectomy    . Tubal ligation     No family history on file. Social History  Substance Use Topics  . Smoking status: Never Smoker   . Smokeless tobacco: None  . Alcohol Use: Yes     Comment: occasionally    OB History    No data available     Review of Systems  Constitutional: Negative for fever and chills.  HENT: Negative.  Negative for trouble swallowing and voice change.   Respiratory: Negative.  Negative for shortness of breath.   Cardiovascular: Positive for chest pain.  Gastrointestinal: Negative.  Negative for  nausea, vomiting and abdominal pain.  Musculoskeletal: Positive for back pain.  Neurological: Negative.       Allergies  Review of patient's allergies indicates no known allergies.  Home Medications   Prior to Admission medications   Medication Sig Start Date End Date Taking? Authorizing Provider  Aspirin-Acetaminophen-Caffeine (GOODY HEADACHE PO) Take 1 packet by mouth every 4 (four) hours as needed (pain).   Yes Historical Provider, MD  levonorgestrel (MIRENA) 20 MCG/24HR IUD 1 each by Intrauterine route once.   Yes Historical Provider, MD   BP 142/94 mmHg  Pulse 52  Temp(Src) 98.4 F (36.9 C) (Oral)  Resp 20  Ht 5\' 7"  (1.702 m)  Wt 117.935 kg  BMI 40.71 kg/m2  SpO2 100%  LMP 06/20/2015 Physical Exam  Constitutional: She is oriented to person, place, and time. She appears well-developed and well-nourished.  HENT:  Head: Normocephalic.  Neck: Normal range of motion. Neck supple.  Cardiovascular: Normal rate and regular rhythm.   Pulmonary/Chest: Effort normal and breath sounds normal. She has no wheezes. She has no rales. She exhibits tenderness.  Abdominal: Soft. Bowel sounds are normal. There is no tenderness. There is no rebound and no guarding.  Musculoskeletal: Normal range of motion.  Neurological: She is alert and oriented to person, place, and time.  Skin: Skin is warm and dry. No rash noted.  Psychiatric: She has a normal mood and affect.    ED Course  Procedures (including critical care time) Labs Review  Labs Reviewed  BASIC METABOLIC PANEL - Abnormal; Notable for the following:    Glucose, Bld 136 (*)    Calcium 8.8 (*)    All other components within normal limits  CBC  I-STAT TROPOININ, ED    Imaging Review Dg Chest 2 View  07/02/2015  CLINICAL DATA:  Central chest pain and pressure for 2 days. EXAM: CHEST  2 VIEW COMPARISON:  08/23/2011 FINDINGS: The lungs are clear. The pulmonary vasculature is normal. Heart size is normal. Hilar and mediastinal  contours are unremarkable. There is no pleural effusion. IMPRESSION: No active cardiopulmonary disease. Electronically Signed   By: Andreas Newport M.D.   On: 07/02/2015 02:10   I have personally reviewed and evaluated these images and lab results as part of my medical decision-making.   EKG Interpretation   Date/Time:  Saturday Jul 02 2015 01:12:42 EDT Ventricular Rate:  64 PR Interval:  162 QRS Duration: 79 QT Interval:  511 QTC Calculation: 527 R Axis:   21 Text Interpretation:  Sinus rhythm Low voltage, precordial leads  Borderline T abnormalities, inferior leads Prolonged QT interval When  compared with ECG of 08/22/2011, QT has lengthened Confirmed by Roxanne Mins  MD,  DAVID ([id number]) on 07/02/2015 1:23:28 AM      MDM   Final diagnoses:  None    1. Dyspepsia  Patient presents with burning type chest pain similar to previous episodes of acid reflux. No fever, cough, vomiting. No SOB. Labs are reassuring, CXR clear, EKG non-acute. She has no tachycardia, SOB or hypoxia - doubt PE. Symptoms have improved significantly with GI cocktail. She is felt stable for discharge home. Return precautions provided.     Charlann Lange, PA-C [id number]  Delora Fuel, MD [id number]

## 2015-07-02 NOTE — ED Notes (Signed)
Pt reports onset of chest pain yesterday. Describes a "pounding" and "soreness" in her chest and back. Pain worse with deep breath.

## 2016-01-04 ENCOUNTER — Other Ambulatory Visit: Payer: Self-pay | Admitting: Family Medicine

## 2016-01-11 ENCOUNTER — Ambulatory Visit (INDEPENDENT_AMBULATORY_CARE_PROVIDER_SITE_OTHER): Payer: Managed Care, Other (non HMO) | Admitting: Obstetrics & Gynecology

## 2016-01-11 ENCOUNTER — Encounter: Payer: Self-pay | Admitting: Obstetrics & Gynecology

## 2016-01-11 VITALS — BP 109/72 | HR 79 | Wt 306.0 lb

## 2016-01-11 DIAGNOSIS — Z30433 Encounter for removal and reinsertion of intrauterine contraceptive device: Secondary | ICD-10-CM | POA: Insufficient documentation

## 2016-01-11 DIAGNOSIS — Z01419 Encounter for gynecological examination (general) (routine) without abnormal findings: Secondary | ICD-10-CM | POA: Diagnosis not present

## 2016-01-11 DIAGNOSIS — R928 Other abnormal and inconclusive findings on diagnostic imaging of breast: Secondary | ICD-10-CM

## 2016-01-11 DIAGNOSIS — Z202 Contact with and (suspected) exposure to infections with a predominantly sexual mode of transmission: Secondary | ICD-10-CM

## 2016-01-11 MED ORDER — LEVONORGESTREL 18.6 MCG/DAY IU IUD
INTRAUTERINE_SYSTEM | Freq: Once | INTRAUTERINE | Status: AC
  Administered 2016-01-11: 1 via INTRAUTERINE

## 2016-01-11 NOTE — Progress Notes (Signed)
Patient ID: Glenda Lee, female   DOB: 06-Aug-1970, 45 y.o.   MRN: [id number]  Chief Complaint  Patient presents with  . Contraception  . Gynecologic Exam    HPI Glenda Lee is a 45 y.o. female. No LMP recorded. Mirena in place for 5 years and she requests replacement. Normal menses, had mammogram by mobile unit at work 4 weeks ago. HPI  Past Medical History:  Diagnosis Date  . Asthma     Past Surgical History:  Procedure Laterality Date  . TONSILLECTOMY    . TUBAL LIGATION      No family history on file.  Social History Social History  Substance Use Topics  . Smoking status: Never Smoker  . Smokeless tobacco: Not on file  . Alcohol use Yes     Comment: occasionally     No Known Allergies  Current Outpatient Prescriptions  Medication Sig Dispense Refill  . Aspirin-Acetaminophen-Caffeine (GOODY HEADACHE PO) Take 1 packet by mouth every 4 (four) hours as needed (pain).    Marland Kitchen levonorgestrel (MIRENA) 20 MCG/24HR IUD 1 each by Intrauterine route once.    Marland Kitchen omeprazole (PRILOSEC) 20 MG capsule Take one twice daily x 7 days, then once daily after that 30 capsule 0   No current facility-administered medications for this visit.     Review of Systems Review of Systems  Constitutional: Negative.   Respiratory: Negative.   Gastrointestinal: Negative.   Genitourinary: Negative.   Skin:       Occasional bilateral nipple discharge    Blood pressure 109/72, pulse 79, weight (!) 306 lb (138.8 kg).  Physical Exam Physical Exam  Constitutional: She is oriented to person, place, and time. She appears well-developed.  obese  Cardiovascular: Normal rate.   Pulmonary/Chest: Effort normal.  Breasts: breasts appear normal, no suspicious masses, no skin or nipple changes or axillary nodes.No discharge    Genitourinary: Vagina normal and uterus normal. No vaginal discharge found.  Genitourinary Comments: Patient identified, informed consent performed, signed copy in chart,  time out was performed.  Urine pregnancy test negative. String grasped with clamp and IUD removed intact Speculum placed in the vagina.  Cervix visualized.  Cleaned with Betadine x 2.  Grasped anteriourly with a single tooth tenaculum.  Uterus sounded to 10 cm.  Liletta IUD placed per manufacturer's recommendations.  Strings trimmed to 3 cm.   Patient given post procedure instructions and  care card with expiration date.  Patient is asked to check IUD strings periodically and follow up in 4-6 weeks for IUD check.  Neurological: She is alert and oriented to person, place, and time.  Skin: Skin is warm and dry.  Psychiatric: She has a normal mood and affect. Her behavior is normal.  Vitals reviewed.   Data Reviewed  Mammo Tomo Screening Bilateral10/25/2017 [facility] Result Impression  IMPRESSION: Further evaluation is needed.  Recommendation: Diagnostic imaging. [place] will contact the patient and make the necessary arrangements.    BI-RADS 0: INCOMPLETE--Need Additional Imaging Evaluation.  Result Narrative  Indication: Breast cancer screening   Comparison: None available  Technique: Routine full field digital mammographic views were obtained with supplemental digital breast tomosynthesis. Computer assisted detection (CAD) was utilized in the interpretation.   Density: There are scattered areas of fibroglandular density.  Findings: Masses lateral aspect of the left breast approximately 11.0 cm away from the nipple    Assessment    Patient Active Problem List   Diagnosis Date Noted  . Encounter for IUD removal  and reinsertion 01/11/2016  Obesity  Well woman exam, pap done today Abnormal mammogram, needs f/u imaging  reports galactorrhea  Plan    Appointment at [facility]  RTC for string check PRL level   [place] 01/11/2016, 4:56 PM

## 2016-01-12 LAB — PROLACTIN: Prolactin: 14.1 ng/mL

## 2016-01-12 LAB — RPR

## 2016-01-12 LAB — HIV ANTIBODY (ROUTINE TESTING W REFLEX): HIV 1&2 Ab, 4th Generation: NONREACTIVE

## 2016-01-12 LAB — HEPATITIS B SURFACE ANTIGEN: Hepatitis B Surface Ag: NEGATIVE

## 2016-01-13 LAB — GC/CHLAMYDIA PROBE AMP (~~LOC~~) NOT AT ARMC
Chlamydia: NEGATIVE
Neisseria Gonorrhea: NEGATIVE

## 2016-01-16 LAB — CYTOLOGY - PAP
Diagnosis: NEGATIVE
HPV: NOT DETECTED

## 2016-01-18 ENCOUNTER — Other Ambulatory Visit: Payer: Managed Care, Other (non HMO)

## 2016-01-23 ENCOUNTER — Ambulatory Visit
Admission: RE | Admit: 2016-01-23 | Discharge: 2016-01-23 | Disposition: A | Discharge status: Home / Self Care | Payer: Managed Care, Other (non HMO) | Source: Ambulatory Visit | Attending: Obstetrics & Gynecology | Admitting: Obstetrics & Gynecology

## 2016-01-23 DIAGNOSIS — R928 Other abnormal and inconclusive findings on diagnostic imaging of breast: Secondary | ICD-10-CM

## 2017-02-14 ENCOUNTER — Other Ambulatory Visit: Payer: Self-pay | Admitting: Physician Assistant

## 2017-02-14 DIAGNOSIS — Z1231 Encounter for screening mammogram for malignant neoplasm of breast: Secondary | ICD-10-CM

## 2017-02-26 ENCOUNTER — Other Ambulatory Visit: Payer: Self-pay

## 2017-02-26 ENCOUNTER — Emergency Department (HOSPITAL_COMMUNITY): Payer: 59

## 2017-02-26 ENCOUNTER — Emergency Department (HOSPITAL_COMMUNITY)
Admission: EM | Admit: 2017-02-26 | Discharge: 2017-02-26 | Disposition: A | Discharge status: Home / Self Care | Payer: 59 | Attending: Emergency Medicine | Admitting: Emergency Medicine

## 2017-02-26 DIAGNOSIS — R05 Cough: Secondary | ICD-10-CM | POA: Insufficient documentation

## 2017-02-26 DIAGNOSIS — R079 Chest pain, unspecified: Secondary | ICD-10-CM

## 2017-02-26 DIAGNOSIS — Z79899 Other long term (current) drug therapy: Secondary | ICD-10-CM | POA: Diagnosis not present

## 2017-02-26 DIAGNOSIS — R030 Elevated blood-pressure reading, without diagnosis of hypertension: Secondary | ICD-10-CM

## 2017-02-26 DIAGNOSIS — R0981 Nasal congestion: Secondary | ICD-10-CM | POA: Diagnosis not present

## 2017-02-26 DIAGNOSIS — J45909 Unspecified asthma, uncomplicated: Secondary | ICD-10-CM | POA: Insufficient documentation

## 2017-02-26 LAB — CBC
HCT: 45.7 % (ref 36.0–46.0)
Hemoglobin: 14.5 g/dL (ref 12.0–15.0)
MCH: 28.5 pg (ref 26.0–34.0)
MCHC: 31.7 g/dL (ref 30.0–36.0)
MCV: 89.8 fL (ref 78.0–100.0)
Platelets: 206 10*3/uL (ref 150–400)
RBC: 5.09 MIL/uL (ref 3.87–5.11)
RDW: 13.9 % (ref 11.5–15.5)
WBC: 4.8 10*3/uL (ref 4.0–10.5)

## 2017-02-26 LAB — I-STAT TROPONIN, ED
Troponin i, poc: 0.01 ng/mL (ref 0.00–0.08)
Troponin i, poc: 0.02 ng/mL (ref 0.00–0.08)

## 2017-02-26 LAB — I-STAT BETA HCG BLOOD, ED (MC, WL, AP ONLY): I-stat hCG, quantitative: <5 m[IU]/mL (ref <5)

## 2017-02-26 LAB — BASIC METABOLIC PANEL
Anion gap: 8 (ref 5–15)
BUN: 8 mg/dL (ref 6–20)
CO2: 21 mmol/L — ABNORMAL LOW (ref 22–32)
Calcium: 9.1 mg/dL (ref 8.9–10.3)
Chloride: 109 mmol/L (ref 101–111)
Creatinine, Ser: 0.87 mg/dL (ref 0.44–1.00)
GFR calc Af Amer: >60 mL/min (ref >60)
GFR calc non Af Amer: >60 mL/min (ref >60)
Glucose, Bld: 116 mg/dL — ABNORMAL HIGH (ref 65–99)
Potassium: 3.8 mmol/L (ref 3.5–5.1)
Sodium: 138 mmol/L (ref 135–145)

## 2017-02-26 NOTE — ED Notes (Signed)
Lab work, radiology results and vital signs reviewed, no critical results at this time, no change in acuity indicated.  

## 2017-02-26 NOTE — ED Provider Notes (Signed)
[facility] EMERGENCY DEPARTMENT Provider Note   CSN: [id number] Arrival date & time: 02/26/17  1248     History   Chief Complaint Chief Complaint  Patient presents with  . Chest Pain    HPI Glenda Lee is a 47 y.o. female.  The history is provided by the patient and medical records. No language interpreter was used.  Chest Pain   Associated symptoms include cough and shortness of breath.   Glenda Lee is a 47 y.o. female  with a PMH of asthma who presents to the Emergency Department complaining of central, non-radiating chest pain which began last night while trying to go to sleep. Denies feeling short of breath, but did feel like she was requiring more effort to breath than usual.  This lasted about 30 minutes, then she went to sleep.  When she awoke this morning she had a similar feeling, prompting her to come to the emergency department.  She states this lasted about an hour and now has resolved. Currently chest pain free with no shortness of breath. She has had a dry cough and nasal congestion for the last week.  No medications taken prior to arrival for symptoms. No history of hypertension, hyperlipidemia, diabetes or heart disease.  No family cardiac history.  No recent surgeries/immobilization/travel.  Not on OCPs. No fever, diaphoresis, nausea, vomiting, abdominal pain, leg swelling.  Past Medical History:  Diagnosis Date  . Asthma     Patient Active Problem List   Diagnosis Date Noted  . Encounter for IUD removal and reinsertion 01/11/2016    Past Surgical History:  Procedure Laterality Date  . TONSILLECTOMY    . TUBAL LIGATION      OB History    No data available       Home Medications    Prior to Admission medications   Medication Sig Start Date End Date Taking? Authorizing Provider  levonorgestrel (MIRENA) 20 MCG/24HR IUD 1 each by Intrauterine route once. Every 5 years   Yes [provider]  omeprazole (PRILOSEC) 20 MG  capsule Take one twice daily x 7 days, then once daily after that Patient not taking: Reported on 02/26/2017 07/02/15   Charlann Lange, PA-C    Family History No family history on file.  Social History Social History   Tobacco Use  . Smoking status: Never Smoker  Substance Use Topics  . Alcohol use: Yes    Comment: occasionally   . Drug use: No     Allergies   Patient has no known allergies.   Review of Systems Review of Systems  HENT: Positive for congestion.   Respiratory: Positive for cough and shortness of breath.   Cardiovascular: Positive for chest pain.  All other systems reviewed and are negative.    Physical Exam Updated Vital Signs BP (!) 170/78 (BP Location: Left Arm)   Pulse (!) 50   Temp 98.5 F (36.9 C) (Oral)   Resp 16   Ht 5\' 7"  (1.702 m)   Wt 132 kg (291 lb)   LMP 02/19/2017   SpO2 100%   BMI 45.58 kg/m   Physical Exam  Constitutional: She is oriented to person, place, and time. She appears well-developed and well-nourished. No distress.  HENT:  Head: Normocephalic and atraumatic.  + Nasal congestion.  No focal sinus tenderness.  Oropharynx is clear.  Neck: Neck supple.  Cardiovascular: Normal rate, regular rhythm and normal heart sounds.  No murmur heard. Pulmonary/Chest: Effort normal and breath sounds normal. No  respiratory distress.  Lungs clear to auscultation bilaterally.  No chest wall tenderness.  Abdominal: Soft. She exhibits no distension. There is no tenderness.  Musculoskeletal:  No lower extremity edema or calf tenderness.  Neurological: She is alert and oriented to person, place, and time.  Skin: Skin is warm and dry.  Nursing note and vitals reviewed.    ED Treatments / Results  Labs (all labs ordered are listed, but only abnormal results are displayed) Labs Reviewed  BASIC METABOLIC PANEL - Abnormal; Notable for the following components:      Result Value   CO2 21 (*)    Glucose, Bld 116 (*)    All other components  within normal limits  CBC  I-STAT TROPONIN, ED  I-STAT BETA HCG BLOOD, ED ([facility], [facility], [facility] ONLY)  I-STAT TROPONIN, ED    EKG  EKG Interpretation  Date/Time:  Tuesday February 26 2017 12:52:40 EST Ventricular Rate:  68 PR Interval:  152 QRS Duration: 78 QT Interval:  398 QTC Calculation: 423 R Axis:   17 Text Interpretation:  Normal sinus rhythm Normal ECG Normal ECG Confirmed by Carmin Muskrat [phone]) on 02/26/2017 8:23:15 PM       Radiology Dg Chest 2 View  Result Date: 02/26/2017 CLINICAL DATA:  Shortness of breath.  Chest pain. EXAM: CHEST  2 VIEW COMPARISON:  07/02/2015. FINDINGS: Mediastinum and hilar structures normal the lungs are clear. Heart size normal. No pleural effusion or pneumothorax. IMPRESSION: No acute cardiopulmonary disease. Electronically Signed   By: Marcello Moores  Register   On: 02/26/2017 13:32    Procedures Procedures (including critical care time)  Medications Ordered in ED Medications - No data to display   Initial Impression / Assessment and Plan / ED Course  I have reviewed the triage vital signs and the nursing notes.  Pertinent labs & imaging results that were available during my care of the patient were reviewed by me and considered in my medical decision making (see chart for details).    Glenda Lee is a 47 y.o. female who presents to ED for chest pain associated with shortness of breath last night. Symptoms now resolved. Has had cough, congestion for about a week prior. On exam, patient is afebrile, hemodynamically stable with normal cardiopulmonary examination.   Labs reviewed and reassuring with negative troponin x2.  CXR with no acute abnormalities.  EKG non-ischemic.   Heart score of 2. PERC negative  Patient's symptoms unlikely to be of cardiac etiology. Labs and imaging reviewed again prior to discharge. Patient has been advised to return to the ED if development of any exertional chest pain, trouble breathing, new/worsening symptoms or  for any additional concerns. Evaluation does not show pathology that would require ongoing emergent intervention or inpatient treatment. Encouraged to follow up with PCP. Patient understands return precautions and follow up plan. All questions answered.   Final Clinical Impressions(s) / ED Diagnoses   Final diagnoses:  Chest pain with low risk for cardiac etiology    ED Discharge Orders    None       Glenda Lee, Glenda Almond, PA-C 02/26/17 2032    Carmin Muskrat, MD 02/27/17 [phone]

## 2017-02-26 NOTE — ED Triage Notes (Signed)
Pt states during the middle of the night she started having pressure in the center of her mid upper back with sob. Pt states she had a hard time falling asleep. When she woke up this am she had a feel feeling in the center of her chest with pressure in her back.

## 2017-02-26 NOTE — Discharge Instructions (Signed)
It was my pleasure taking care of you today!   Mucinex and flonase as needed for nasal congestion.   You were seen in the Emergency Department today for chest pain.  As we have discussed, today?s blood work and imaging are normal, but you may require further testing.  Please call your primary care physician to schedule a follow up appointment to discuss your ER visit today. If you do not have one, please see the information below.   Return to the Emergency Department if you experience any further chest pain/pressure/tightness, difficulty breathing, sudden sweating, or other symptoms that concern you.  To find a primary care or specialty doctor please call [phone] or [phone] to access "[facility]."  You may also go on the [facility] website at [email or web address]  There are also multiple [facility] practices throughout the [place] that are frequently accepting new patients. You may find a clinic that is close to your home and contact them.  [place] [id number] [phone]  [facility] in [place] (also locations in [place] and [place]) - [place] [phone]  [place] [phone]

## 2017-02-26 NOTE — ED Notes (Signed)
ED Provider at bedside. 

## 2017-03-11 ENCOUNTER — Ambulatory Visit
Admission: RE | Admit: 2017-03-11 | Discharge: 2017-03-11 | Disposition: A | Discharge status: Home / Self Care | Payer: 59 | Source: Ambulatory Visit | Attending: Physician Assistant | Admitting: Physician Assistant

## 2017-03-11 DIAGNOSIS — Z1231 Encounter for screening mammogram for malignant neoplasm of breast: Secondary | ICD-10-CM

## 2017-04-23 ENCOUNTER — Other Ambulatory Visit: Payer: Self-pay | Admitting: Internal Medicine

## 2017-04-23 DIAGNOSIS — N644 Mastodynia: Secondary | ICD-10-CM

## 2017-05-01 ENCOUNTER — Other Ambulatory Visit: Payer: Self-pay | Admitting: Internal Medicine

## 2017-05-03 ENCOUNTER — Other Ambulatory Visit: Payer: Self-pay | Admitting: Internal Medicine

## 2017-05-03 DIAGNOSIS — N644 Mastodynia: Secondary | ICD-10-CM

## 2017-05-09 ENCOUNTER — Ambulatory Visit
Admission: RE | Admit: 2017-05-09 | Discharge: 2017-05-09 | Disposition: A | Discharge status: Home / Self Care | Payer: 59 | Source: Ambulatory Visit | Attending: Internal Medicine | Admitting: Internal Medicine

## 2017-05-09 DIAGNOSIS — N644 Mastodynia: Secondary | ICD-10-CM

## 2017-05-29 ENCOUNTER — Emergency Department (HOSPITAL_COMMUNITY): Payer: 59

## 2017-05-29 ENCOUNTER — Encounter (HOSPITAL_COMMUNITY): Payer: Self-pay | Admitting: Emergency Medicine

## 2017-05-29 ENCOUNTER — Other Ambulatory Visit: Payer: Self-pay

## 2017-05-29 ENCOUNTER — Emergency Department (HOSPITAL_COMMUNITY)
Admission: EM | Admit: 2017-05-29 | Discharge: 2017-05-29 | Disposition: A | Discharge status: Home / Self Care | Payer: 59 | Attending: Emergency Medicine | Admitting: Emergency Medicine

## 2017-05-29 DIAGNOSIS — M25551 Pain in right hip: Secondary | ICD-10-CM | POA: Insufficient documentation

## 2017-05-29 DIAGNOSIS — J45909 Unspecified asthma, uncomplicated: Secondary | ICD-10-CM | POA: Insufficient documentation

## 2017-05-29 LAB — URINALYSIS, ROUTINE W REFLEX MICROSCOPIC
Bacteria, UA: NONE SEEN
Bilirubin Urine: NEGATIVE
Glucose, UA: NEGATIVE mg/dL
Hgb urine dipstick: NEGATIVE
Ketones, ur: 5 mg/dL — AB
Leukocytes, UA: NEGATIVE
Nitrite: NEGATIVE
Protein, ur: 30 mg/dL — AB
Specific Gravity, Urine: 1.025 (ref 1.005–1.030)
pH: 6 (ref 5.0–8.0)

## 2017-05-29 MED ORDER — KETOROLAC TROMETHAMINE 30 MG/ML IJ SOLN
60.0000 mg | Freq: Once | INTRAMUSCULAR | Status: AC
  Administered 2017-05-29: 60 mg via INTRAMUSCULAR
  Filled 2017-05-29: qty 2

## 2017-05-29 NOTE — ED Triage Notes (Signed)
Pt arrives via POV for back pain since Monday, states took a goody powder and was effective. States R leg swelling. States pain began to move into R leg with pulling/tearing sensation. Denies trauma. Ambulatory but painful.

## 2017-05-29 NOTE — ED Provider Notes (Signed)
[facility] EMERGENCY DEPARTMENT Provider Note   CSN: [id number] Arrival date & time: 05/29/17  3086     History   Chief Complaint Chief Complaint  Patient presents with  . Back Pain    HPI Glenda Lee is a 47 y.o. female.  HPI 47 year old female presents with right buttock pain.  It is lateral.  She states she has had a "catch" on and off for a couple years and this exact same area.  However starting 3 days ago the pain has been constant and more severe.  It feels sharp and like a tearing.  She feels like there is localized swelling to the lateral aspect of her thigh/hip.  There has been no trauma.  There is no back pain or abdominal pain.  The pain does not radiate.  Her leg is not weak or numb but it is hard to lift due to the degree of pain.  She has been taking Goody powder and states that up until last night it has been helping in relieving the pain.  Pain is severe. LMP 3 weeks ago. No urinary symptoms. Laying on left side helps pain.  Past Medical History:  Diagnosis Date  . Asthma     Patient Active Problem List   Diagnosis Date Noted  . Encounter for IUD removal and reinsertion 01/11/2016    Past Surgical History:  Procedure Laterality Date  . TONSILLECTOMY    . TUBAL LIGATION       OB History   None      Home Medications    Prior to Admission medications   Medication Sig Start Date End Date Taking? Authorizing Provider  levonorgestrel (MIRENA) 20 MCG/24HR IUD 1 each by Intrauterine route once. Every 5 years    [provider]  omeprazole (PRILOSEC) 20 MG capsule Take one twice daily x 7 days, then once daily after that Patient not taking: Reported on 02/26/2017 07/02/15   Charlann Lange, PA-C    Family History No family history on file.  Social History Social History   Tobacco Use  . Smoking status: Never Smoker  . Smokeless tobacco: Never Used  Substance Use Topics  . Alcohol use: Yes    Comment: occasionally   .  Drug use: No     Allergies   Patient has no known allergies.   Review of Systems Review of Systems  Constitutional: Negative for fever.  Gastrointestinal: Negative for abdominal pain.  Genitourinary: Negative for dysuria and hematuria.  Musculoskeletal: Positive for myalgias.  Neurological: Negative for weakness and numbness.  All other systems reviewed and are negative.    Physical Exam Updated Vital Signs BP (!) 126/98 (BP Location: Right Arm)   Pulse 89   Temp 98.7 F (37.1 C) (Oral)   Resp 18   Ht 5\' 7"  (1.702 m)   Wt 134.3 kg (296 lb)   LMP 05/07/2017   SpO2 99%   BMI 46.36 kg/m   Physical Exam  Constitutional: She is oriented to person, place, and time. She appears well-developed and well-nourished. No distress.  Obese  HENT:  Head: Normocephalic and atraumatic.  Right Ear: External ear normal.  Left Ear: External ear normal.  Nose: Nose normal.  Eyes: Right eye exhibits no discharge. Left eye exhibits no discharge.  Cardiovascular: Normal rate, regular rhythm and normal heart sounds.  Pulses:      Dorsalis pedis pulses are 2+ on the right side, and 2+ on the left side.  Pulmonary/Chest: Effort normal and  breath sounds normal.  Abdominal: Soft. There is no tenderness.  Musculoskeletal:       Right hip: She exhibits tenderness (lateral). She exhibits normal range of motion and no swelling.       Lumbar back: She exhibits no tenderness and no bony tenderness.       Legs: Tenderness to the lateral right buttock/proximal thigh over greater trochanter. No appreciable swelling. No calf or thigh swelling or medial tenderness  Neurological: She is alert and oriented to person, place, and time.  5/5 strength in BLE. Normal gross sensation. Pain somewhat limits RLE strength testing  Skin: Skin is warm and dry. She is not diaphoretic.  Nursing note and vitals reviewed.    ED Treatments / Results  Labs (all labs ordered are listed, but only abnormal results are  displayed) Labs Reviewed  URINALYSIS, ROUTINE W REFLEX MICROSCOPIC - Abnormal; Notable for the following components:      Result Value   APPearance HAZY (*)    Ketones, ur 5 (*)    Protein, ur 30 (*)    Squamous Epithelial / LPF 0-5 (*)    All other components within normal limits    EKG None  Radiology Dg Hip Unilat With Pelvis 2-3 Views Right  Result Date: 05/29/2017 CLINICAL DATA:  Onset of right lateral hip and buttock pain with no known injury. EXAM: DG HIP (WITH OR WITHOUT PELVIS) 2-3V RIGHT COMPARISON:  Right hip series of October 15, 2014. FINDINGS: The bony pelvis is subjectively adequately mineralized. There is no lytic nor blastic lesion. An IUD is present. AP and lateral views of the right hip reveal mild symmetric narrowing of the joint space similar to that seen previously. The articular surfaces of the femoral head and acetabulum remains smoothly rounded. The femoral neck, intertrochanteric, and subtrochanteric regions are normal. IMPRESSION: There is no acute bony abnormality of the right hip. There is mild symmetric joint space loss which appears stable. Electronically Signed   By: David  Martinique M.D.   On: 05/29/2017 10:24    Procedures Procedures (including critical care time)  Medications Ordered in ED Medications  ketorolac (TORADOL) 30 MG/ML injection 60 mg (60 mg Intramuscular Given 05/29/17 0919)     Initial Impression / Assessment and Plan / ED Course  I have reviewed the triage vital signs and the nursing notes.  Pertinent labs & imaging results that were available during my care of the patient were reviewed by me and considered in my medical decision making (see chart for details).     Patient's pain is probably muscular.  There is no radiation of the pain to suggest sciatica specifically but it could be similar type pain.  She describes swelling to her leg but I do not appreciate any swelling.  There is no medial swelling or calf swelling so I think DVT is  highly unlikely.  This appears to be muscular and I recommended NSAIDs and heat.  Follow-up with PCP and may need some physical therapy.  However no obvious fractures and no incontinence or other acute neuro symptoms to suggest a spinal emergency.  Discharge home with return precautions.  Final Clinical Impressions(s) / ED Diagnoses   Final diagnoses:  Acute pain of right hip    ED Discharge Orders    None       Sherwood Gambler, MD 05/29/17 1102

## 2017-07-24 DIAGNOSIS — M5416 Radiculopathy, lumbar region: Secondary | ICD-10-CM | POA: Diagnosis not present

## 2017-07-24 DIAGNOSIS — M9903 Segmental and somatic dysfunction of lumbar region: Secondary | ICD-10-CM | POA: Diagnosis not present

## 2017-07-24 DIAGNOSIS — M62830 Muscle spasm of back: Secondary | ICD-10-CM | POA: Diagnosis not present

## 2017-07-26 DIAGNOSIS — M5416 Radiculopathy, lumbar region: Secondary | ICD-10-CM | POA: Diagnosis not present

## 2017-07-26 DIAGNOSIS — M62830 Muscle spasm of back: Secondary | ICD-10-CM | POA: Diagnosis not present

## 2017-07-26 DIAGNOSIS — M9903 Segmental and somatic dysfunction of lumbar region: Secondary | ICD-10-CM | POA: Diagnosis not present

## 2017-07-29 DIAGNOSIS — F411 Generalized anxiety disorder: Secondary | ICD-10-CM | POA: Diagnosis not present

## 2017-07-29 DIAGNOSIS — R69 Illness, unspecified: Secondary | ICD-10-CM | POA: Diagnosis not present

## 2017-07-31 DIAGNOSIS — M9903 Segmental and somatic dysfunction of lumbar region: Secondary | ICD-10-CM | POA: Diagnosis not present

## 2017-07-31 DIAGNOSIS — M62830 Muscle spasm of back: Secondary | ICD-10-CM | POA: Diagnosis not present

## 2017-07-31 DIAGNOSIS — M5416 Radiculopathy, lumbar region: Secondary | ICD-10-CM | POA: Diagnosis not present

## 2017-08-12 DIAGNOSIS — E785 Hyperlipidemia, unspecified: Secondary | ICD-10-CM | POA: Diagnosis not present

## 2017-08-12 DIAGNOSIS — N289 Disorder of kidney and ureter, unspecified: Secondary | ICD-10-CM | POA: Diagnosis not present

## 2017-08-12 DIAGNOSIS — M25512 Pain in left shoulder: Secondary | ICD-10-CM | POA: Diagnosis not present

## 2017-08-12 DIAGNOSIS — I10 Essential (primary) hypertension: Secondary | ICD-10-CM | POA: Diagnosis not present

## 2017-08-12 DIAGNOSIS — M62838 Other muscle spasm: Secondary | ICD-10-CM | POA: Diagnosis not present

## 2017-08-12 DIAGNOSIS — R7303 Prediabetes: Secondary | ICD-10-CM | POA: Diagnosis not present

## 2017-08-26 DIAGNOSIS — M25512 Pain in left shoulder: Secondary | ICD-10-CM | POA: Diagnosis not present

## 2017-08-26 DIAGNOSIS — L03113 Cellulitis of right upper limb: Secondary | ICD-10-CM | POA: Diagnosis not present

## 2017-08-26 DIAGNOSIS — R7303 Prediabetes: Secondary | ICD-10-CM | POA: Diagnosis not present

## 2017-08-26 DIAGNOSIS — E785 Hyperlipidemia, unspecified: Secondary | ICD-10-CM | POA: Diagnosis not present

## 2017-08-26 DIAGNOSIS — Z0000 Encounter for general adult medical examination without abnormal findings: Secondary | ICD-10-CM | POA: Diagnosis not present

## 2017-08-26 DIAGNOSIS — Z0100 Encounter for examination of eyes and vision without abnormal findings: Secondary | ICD-10-CM | POA: Diagnosis not present

## 2017-08-26 DIAGNOSIS — R69 Illness, unspecified: Secondary | ICD-10-CM | POA: Diagnosis not present

## 2017-08-26 DIAGNOSIS — M62838 Other muscle spasm: Secondary | ICD-10-CM | POA: Diagnosis not present

## 2017-08-26 DIAGNOSIS — Z113 Encounter for screening for infections with a predominantly sexual mode of transmission: Secondary | ICD-10-CM | POA: Diagnosis not present

## 2017-08-26 DIAGNOSIS — Z0110 Encounter for examination of ears and hearing without abnormal findings: Secondary | ICD-10-CM | POA: Diagnosis not present

## 2017-08-26 DIAGNOSIS — N289 Disorder of kidney and ureter, unspecified: Secondary | ICD-10-CM | POA: Diagnosis not present

## 2017-08-26 DIAGNOSIS — I10 Essential (primary) hypertension: Secondary | ICD-10-CM | POA: Diagnosis not present

## 2017-08-26 DIAGNOSIS — Z131 Encounter for screening for diabetes mellitus: Secondary | ICD-10-CM | POA: Diagnosis not present

## 2017-12-06 DIAGNOSIS — L03113 Cellulitis of right upper limb: Secondary | ICD-10-CM | POA: Diagnosis not present

## 2017-12-06 DIAGNOSIS — M25512 Pain in left shoulder: Secondary | ICD-10-CM | POA: Diagnosis not present

## 2017-12-06 DIAGNOSIS — E785 Hyperlipidemia, unspecified: Secondary | ICD-10-CM | POA: Diagnosis not present

## 2017-12-06 DIAGNOSIS — L0293 Carbuncle, unspecified: Secondary | ICD-10-CM | POA: Diagnosis not present

## 2017-12-06 DIAGNOSIS — M62838 Other muscle spasm: Secondary | ICD-10-CM | POA: Diagnosis not present

## 2017-12-06 DIAGNOSIS — N289 Disorder of kidney and ureter, unspecified: Secondary | ICD-10-CM | POA: Diagnosis not present

## 2017-12-06 DIAGNOSIS — I10 Essential (primary) hypertension: Secondary | ICD-10-CM | POA: Diagnosis not present

## 2017-12-23 ENCOUNTER — Encounter: Payer: Self-pay | Admitting: Advanced Practice Midwife

## 2017-12-23 ENCOUNTER — Ambulatory Visit (INDEPENDENT_AMBULATORY_CARE_PROVIDER_SITE_OTHER): Payer: 59 | Admitting: Advanced Practice Midwife

## 2017-12-23 VITALS — BP 130/74 | HR 62 | Wt 288.7 lb

## 2017-12-23 DIAGNOSIS — Z1151 Encounter for screening for human papillomavirus (HPV): Secondary | ICD-10-CM | POA: Diagnosis not present

## 2017-12-23 DIAGNOSIS — Z113 Encounter for screening for infections with a predominantly sexual mode of transmission: Secondary | ICD-10-CM | POA: Diagnosis not present

## 2017-12-23 DIAGNOSIS — Z01419 Encounter for gynecological examination (general) (routine) without abnormal findings: Secondary | ICD-10-CM

## 2017-12-23 DIAGNOSIS — Z124 Encounter for screening for malignant neoplasm of cervix: Secondary | ICD-10-CM

## 2017-12-23 NOTE — Progress Notes (Signed)
GYNECOLOGY ANNUAL PREVENTATIVE CARE ENCOUNTER NOTE  Subjective:   Glenda Lee is a 47 y.o. No obstetric history on file. female here for a routine annual gynecologic exam.  Current complaints: none.   Denies abnormal vaginal bleeding, discharge, pelvic pain, problems with intercourse or other gynecologic concerns.    Gynecologic History Patient's last menstrual period was 11/22/2017. Contraception: IUD Last Pap: 2017. Results were: normal Last mammogram: 03/11/17. Results were: normal  Obstetric History OB History  No data available    Past Medical History:  Diagnosis Date  . Asthma     Past Surgical History:  Procedure Laterality Date  . TONSILLECTOMY    . TUBAL LIGATION      Current Outpatient Medications on File Prior to Visit  Medication Sig Dispense Refill  . hydrochlorothiazide (HYDRODIURIL) 25 MG tablet Take 25 mg by mouth daily.    . diclofenac sodium (VOLTAREN) 1 % GEL     . doxycycline (VIBRAMYCIN) 100 MG capsule     . levonorgestrel (MIRENA) 20 MCG/24HR IUD 1 each by Intrauterine route once. Every 5 years    . meloxicam (MOBIC) 15 MG tablet     . tiZANidine (ZANAFLEX) 4 MG tablet      No current facility-administered medications on file prior to visit.     No Known Allergies  Social History   Socioeconomic History  . Marital status: Single    Spouse name: Not on file  . Number of children: Not on file  . Years of education: Not on file  . Highest education level: Not on file  Occupational History  . Not on file  Social Needs  . Financial resource strain: Not on file  . Food insecurity:    Worry: Not on file    Inability: Not on file  . Transportation needs:    Medical: Not on file    Non-medical: Not on file  Tobacco Use  . Smoking status: Never Smoker  . Smokeless tobacco: Never Used  Substance and Sexual Activity  . Alcohol use: Yes    Comment: occasionally   . Drug use: No  . Sexual activity: Yes    Birth control/protection: Surgical   Lifestyle  . Physical activity:    Days per week: Not on file    Minutes per session: Not on file  . Stress: Not on file  Relationships  . Social connections:    Talks on phone: Not on file    Gets together: Not on file    Attends religious service: Not on file    Active member of club or organization: Not on file    Attends meetings of clubs or organizations: Not on file    Relationship status: Not on file  . Intimate partner violence:    Fear of current or ex partner: Not on file    Emotionally abused: Not on file    Physically abused: Not on file    Forced sexual activity: Not on file  Other Topics Concern  . Not on file  Social History Narrative  . Not on file    History reviewed. No pertinent family history.  The following portions of the patient's history were reviewed and updated as appropriate: allergies, current medications, past family history, past medical history, past social history, past surgical history and problem list.  Review of Systems Pertinent items noted in HPI and remainder of comprehensive ROS otherwise negative.   Objective:  BP 130/74 (BP Location: Right Arm, Patient Position: Sitting, Cuff Size: Large)  Pulse 62   Wt 288 lb 11.2 oz (131 kg)   LMP 11/22/2017   BMI 45.22 kg/m  CONSTITUTIONAL: Well-developed, well-nourished female in no acute distress.  HENT:  Normocephalic, atraumatic, External right and left ear normal. Oropharynx is clear and moist EYES: Conjunctivae and EOM are normal. Pupils are equal, round, and reactive to light. No scleral icterus.  NECK: Normal range of motion, supple, no masses.  Normal thyroid.  SKIN: Skin is warm and dry. No rash noted. Not diaphoretic. No erythema. No pallor. NEUROLOGIC: Alert and oriented to person, place, and time. Normal reflexes, muscle tone coordination. No cranial nerve deficit noted. PSYCHIATRIC: Normal mood and affect. Normal behavior. Normal judgment and thought content. CARDIOVASCULAR:  Normal heart rate noted, regular rhythm RESPIRATORY: Clear to auscultation bilaterally. Effort and breath sounds normal, no problems with respiration noted. BREASTS: Symmetric in size. No masses, skin changes, nipple drainage, or lymphadenopathy. ABDOMEN: Soft, normal bowel sounds, no distention noted.  No tenderness, rebound or guarding.  PELVIC: Normal appearing external genitalia; normal appearing vaginal mucosa and cervix.  No abnormal discharge noted.  Pap smear obtained.  Normal uterine size, no other palpable masses, no uterine or adnexal tenderness. MUSCULOSKELETAL: Normal range of motion. No tenderness.  No cyanosis, clubbing, or edema.  2+ distal pulses.   Assessment and Plan:  1. Well woman exam with routine gynecological exam - Routine care - Cytology - PAP( [facility]) - MM DIGITAL SCREENING BILATERAL; Future  Will follow up results of pap smear and manage accordingly. Mammogram scheduled Routine preventative health maintenance measures emphasized. Please refer to After Visit Summary for other counseling recommendations.

## 2017-12-23 NOTE — Patient Instructions (Signed)
Breast Self-Awareness Breast self-awareness means:  Knowing how your breasts look.  Knowing how your breasts feel.  Checking your breasts every month for changes.  Telling your doctor if you notice a change in your breasts.  Breast self-awareness allows you to notice a breast problem early while it is still small. How to do a breast self-exam One way to learn what is normal for your breasts and to check for changes is to do a breast self-exam. To do a breast self-exam: Look for Changes  1. Take off all the clothes above your waist. 2. Stand in front of a mirror in a room with good lighting. 3. Put your hands on your hips. 4. Push your hands down. 5. Look at your breasts and nipples in the mirror to see if one breast or nipple looks different than the other. Check to see if: ? The shape of one breast is different. ? The size of one breast is different. ? There are wrinkles, dips, and bumps in one breast and not the other. 6. Look at each breast for changes in your skin, such as: ? Redness. ? Scaly areas. 7. Look for changes in your nipples, such as: ? Liquid around the nipples. ? Bleeding. ? Dimpling. ? Redness. ? A change in where the nipples are. Feel for Changes 1. Lie on your back on the floor. 2. Feel each breast. To do this, follow these steps: ? Pick a breast to feel. ? Put the arm closest to that breast above your head. ? Use your other arm to feel the nipple area of your breast. Feel the area with the pads of your three middle fingers by making small circles with your fingers. For the first circle, press lightly. For the second circle, press harder. For the third circle, press even harder. ? Keep making circles with your fingers at the light, harder, and even harder pressures as you move down your breast. Stop when you feel your ribs. ? Move your fingers a little toward the center of your body. ? Start making circles with your fingers again, this time going up until  you reach your collarbone. ? Keep making up and down circles until you reach your armpit. Remember to keep using the three pressures. ? Feel the other breast in the same way. 3. Sit or stand in the shower or tub. 4. With soapy water on your skin, feel each breast the same way you did in step 2, when you were lying on the floor. Write Down What You Find  After doing the self-exam, write down:  What is normal for each breast.  Any changes you find in each breast.  When you last had your period.  How often should I check my breasts? Check your breasts every month. If you are breastfeeding, the best time to check them is after you feed your baby or after you use a breast pump. If you get periods, the best time to check your breasts is 5-7 days after your period is over. When should I see my doctor? See your doctor if you notice:  A change in shape or size of your breasts or nipples.  A change in the skin of your breast or nipples, such as red or scaly skin.  Unusual fluid coming from your nipples.  A lump or thick area that was not there before.  Pain in your breasts.  Anything that concerns you.  This information is not intended to replace advice given to   you by your health care provider. Make sure you discuss any questions you have with your health care provider. Document Released: 07/25/2007 Document Revised: 07/14/2015 Document Reviewed: 12/26/2014 Elsevier Interactive Patient Education  2018 Elsevier Inc.  

## 2017-12-24 DIAGNOSIS — M5412 Radiculopathy, cervical region: Secondary | ICD-10-CM | POA: Diagnosis not present

## 2017-12-24 DIAGNOSIS — M9901 Segmental and somatic dysfunction of cervical region: Secondary | ICD-10-CM | POA: Diagnosis not present

## 2017-12-24 DIAGNOSIS — M62830 Muscle spasm of back: Secondary | ICD-10-CM | POA: Diagnosis not present

## 2017-12-25 ENCOUNTER — Telehealth: Payer: Self-pay

## 2017-12-25 DIAGNOSIS — M62830 Muscle spasm of back: Secondary | ICD-10-CM | POA: Diagnosis not present

## 2017-12-25 DIAGNOSIS — M9901 Segmental and somatic dysfunction of cervical region: Secondary | ICD-10-CM | POA: Diagnosis not present

## 2017-12-25 DIAGNOSIS — M5412 Radiculopathy, cervical region: Secondary | ICD-10-CM | POA: Diagnosis not present

## 2017-12-25 LAB — CYTOLOGY - PAP
Chlamydia: NEGATIVE
Diagnosis: NEGATIVE
HPV: NOT DETECTED
Neisseria Gonorrhea: NEGATIVE

## 2017-12-25 NOTE — Telephone Encounter (Signed)
-----   Message from Tresea Mall, CNM sent at 12/25/2017  6:06 PM EST ----- Patient's pap is normal. Can someone call her, and let her know. Also let her know about her mammo appt in January (she left prior to the appt being scheduled and she doesn't have mychart).

## 2017-12-25 NOTE — Telephone Encounter (Signed)
Called pt to advise her of her papsmear results, and that her mammogram was scheduled. Pt verbalized understanding and had no questions.

## 2018-01-03 ENCOUNTER — Encounter (HOSPITAL_COMMUNITY): Payer: Self-pay | Admitting: Emergency Medicine

## 2018-01-03 ENCOUNTER — Emergency Department (HOSPITAL_COMMUNITY)
Admission: EM | Admit: 2018-01-03 | Discharge: 2018-01-03 | Disposition: A | Discharge status: Home / Self Care | Payer: 59 | Attending: Emergency Medicine | Admitting: Emergency Medicine

## 2018-01-03 ENCOUNTER — Other Ambulatory Visit: Payer: Self-pay

## 2018-01-03 ENCOUNTER — Emergency Department (HOSPITAL_COMMUNITY): Payer: 59

## 2018-01-03 DIAGNOSIS — R112 Nausea with vomiting, unspecified: Secondary | ICD-10-CM | POA: Diagnosis not present

## 2018-01-03 DIAGNOSIS — J45909 Unspecified asthma, uncomplicated: Secondary | ICD-10-CM | POA: Diagnosis not present

## 2018-01-03 DIAGNOSIS — M546 Pain in thoracic spine: Secondary | ICD-10-CM | POA: Diagnosis not present

## 2018-01-03 DIAGNOSIS — R1011 Right upper quadrant pain: Secondary | ICD-10-CM

## 2018-01-03 LAB — CBC
HCT: 44.8 % (ref 36.0–46.0)
Hemoglobin: 14.2 g/dL (ref 12.0–15.0)
MCH: 28.2 pg (ref 26.0–34.0)
MCHC: 31.7 g/dL (ref 30.0–36.0)
MCV: 89.1 fL (ref 80.0–100.0)
Platelets: 213 10*3/uL (ref 150–400)
RBC: 5.03 MIL/uL (ref 3.87–5.11)
RDW: 13.8 % (ref 11.5–15.5)
WBC: 9.2 10*3/uL (ref 4.0–10.5)
nRBC: 0 % (ref 0.0–0.2)

## 2018-01-03 LAB — COMPREHENSIVE METABOLIC PANEL
ALT: 25 U/L (ref 0–44)
AST: 21 U/L (ref 15–41)
Albumin: 4 g/dL (ref 3.5–5.0)
Alkaline Phosphatase: 89 U/L (ref 38–126)
Anion gap: 9 (ref 5–15)
BUN: 12 mg/dL (ref 6–20)
CO2: 24 mmol/L (ref 22–32)
Calcium: 8.7 mg/dL — ABNORMAL LOW (ref 8.9–10.3)
Chloride: 105 mmol/L (ref 98–111)
Creatinine, Ser: 0.86 mg/dL (ref 0.44–1.00)
GFR calc Af Amer: >60 mL/min (ref >60)
GFR calc non Af Amer: >60 mL/min (ref >60)
Glucose, Bld: 96 mg/dL (ref 70–99)
Potassium: 3.6 mmol/L (ref 3.5–5.1)
Sodium: 138 mmol/L (ref 135–145)
Total Bilirubin: 0.8 mg/dL (ref 0.3–1.2)
Total Protein: 7.4 g/dL (ref 6.5–8.1)

## 2018-01-03 LAB — I-STAT BETA HCG BLOOD, ED (MC, WL, AP ONLY): I-stat hCG, quantitative: <5 m[IU]/mL (ref <5)

## 2018-01-03 LAB — LIPASE, BLOOD: Lipase: 25 U/L (ref 11–51)

## 2018-01-03 MED ORDER — SODIUM CHLORIDE 0.9 % IV SOLN
INTRAVENOUS | Status: DC
  Administered 2018-01-03: 15:00:00 via INTRAVENOUS

## 2018-01-03 MED ORDER — HYDROMORPHONE HCL 1 MG/ML IJ SOLN
1.0000 mg | Freq: Once | INTRAMUSCULAR | Status: AC
  Administered 2018-01-03: 1 mg via INTRAVENOUS
  Filled 2018-01-03: qty 1

## 2018-01-03 MED ORDER — PROMETHAZINE HCL 25 MG/ML IJ SOLN
12.5000 mg | Freq: Once | INTRAMUSCULAR | Status: AC
  Administered 2018-01-03: 12.5 mg via INTRAVENOUS
  Filled 2018-01-03: qty 1

## 2018-01-03 MED ORDER — HYDROCODONE-ACETAMINOPHEN 5-325 MG PO TABS: 2.0000 | ORAL_TABLET | Freq: Four times a day (QID) | ORAL | 0 refills | Status: DC | PRN

## 2018-01-03 MED ORDER — ONDANSETRON HCL 4 MG PO TABS: 4.0000 mg | ORAL_TABLET | Freq: Four times a day (QID) | ORAL | 0 refills | Status: DC | PRN

## 2018-01-03 NOTE — ED Provider Notes (Signed)
[facility] DEPT Provider Note   CSN: [id number] Arrival date & time: 01/03/18  1224     History   Chief Complaint Chief Complaint  Patient presents with  . Abdominal Pain  . Back Pain    HPI Glenda Lee is a 47 y.o. female.  HPI   47 year old female with abdominal pain.  Right upper quadrant.  She has been having intermittent right mid thoracic back pain for the past few days.  Felt like an ache.  She had increasing pain in this area in the past day and also in the right upper quadrant.  She feels better when she is leaning forward.  Worse when she is sitting upright.  Associated with nausea and vomited once.  No fevers or chills.  No history of similar complaints.  She has not tried taking anything for her symptoms.  No clear postprandial changes.  Surgical history significant for tubal ligation.  Past Medical History:  Diagnosis Date  . Asthma     Patient Active Problem List   Diagnosis Date Noted  . Encounter for IUD removal and reinsertion 01/11/2016    Past Surgical History:  Procedure Laterality Date  . TONSILLECTOMY    . TUBAL LIGATION       OB History   None      Home Medications    Prior to Admission medications   Medication Sig Start Date End Date Taking? Authorizing Provider  diclofenac sodium (VOLTAREN) 1 % GEL  12/06/17   [provider]  doxycycline (VIBRAMYCIN) 100 MG capsule  12/06/17   [provider]  hydrochlorothiazide (HYDRODIURIL) 25 MG tablet Take 25 mg by mouth daily.    [provider]  levonorgestrel (MIRENA) 20 MCG/24HR IUD 1 each by Intrauterine route once. Every 5 years    [provider]  meloxicam (MOBIC) 15 MG tablet  12/06/17   [provider]  tiZANidine (ZANAFLEX) 4 MG tablet  12/06/17   [provider]    Family History No family history on file.  Social History Social History   Tobacco Use  . Smoking status: Never Smoker  .  Smokeless tobacco: Never Used  Substance Use Topics  . Alcohol use: Yes    Comment: occasionally   . Drug use: No     Allergies   Patient has no known allergies.   Review of Systems Review of Systems  All systems reviewed and negative, other than as noted in HPI.  Physical Exam Updated Vital Signs BP (!) 163/81 (BP Location: Left Arm)   Pulse 64   Temp 98.4 F (36.9 C) (Oral)   Resp 16   Ht 5\' 7"  (1.702 m)   Wt 132.5 kg   LMP 12/23/2017   BMI 45.73 kg/m   Physical Exam  Constitutional: She appears well-developed and well-nourished. No distress.  HENT:  Head: Normocephalic and atraumatic.  Eyes: Conjunctivae are normal. Right eye exhibits no discharge. Left eye exhibits no discharge.  Neck: Neck supple.  Cardiovascular: Normal rate, regular rhythm and normal heart sounds. Exam reveals no gallop and no friction rub.  No murmur heard. Pulmonary/Chest: Effort normal and breath sounds normal. No respiratory distress.  Abdominal: Soft. She exhibits no distension. There is tenderness.  Right upper quadrant tenderness without rebound or guarding.  No distention.  Musculoskeletal: She exhibits no edema or tenderness.  Neurological: She is alert.  Skin: Skin is warm and dry.  Psychiatric: She has a normal mood and affect. Her behavior is normal.  Thought content normal.  Nursing note and vitals reviewed.    ED Treatments / Results  Labs (all labs ordered are listed, but only abnormal results are displayed) Labs Reviewed  COMPREHENSIVE METABOLIC PANEL - Abnormal; Notable for the following components:      Result Value   Calcium 8.7 (*)    All other components within normal limits  LIPASE, BLOOD  CBC  I-STAT BETA HCG BLOOD, ED ([facility], [facility], [facility] ONLY)    EKG None  Radiology No results found.  Procedures Procedures (including critical care time)  Medications Ordered in ED Medications - No data to display   Initial Impression / Assessment and Plan / ED Course  I  have reviewed the triage vital signs and the nursing notes.  Pertinent labs & imaging results that were available during my care of the patient were reviewed by me and considered in my medical decision making (see chart for details).     47 year old female with right upper quadrant pain and right thoracic pain.  Suspect symptomatic cholelithiasis.  She is afebrile.  LFTs and lipase are normal.  No leukocytosis.  We will treat her symptoms.  Will obtain right upper quadrant ultrasound.  Final Clinical Impressions(s) / ED Diagnoses   Final diagnoses:  RUQ pain    ED Discharge Orders    None       Virgel Manifold, MD 01/04/18 1259

## 2018-01-03 NOTE — ED Triage Notes (Signed)
Pt reports that she had back pressure for 3 days and now pains are coming around to RUQ. Pt reports pains are so bad now that she isnt able to sit straight up. Pt reports she got really nasueated two days ago while on her way to work and had to stop and vomit.  Denies n/v/d since. Reports had hx bad back and abd pains before and was dx with GERD and gas-which was relieved by medications that caused "me to burp a lot, but this pain feels different."

## 2018-01-03 NOTE — ED Provider Notes (Signed)
Signed out to d/c to home if/when u/s back.  U/s reviewed - neg for acute process, neg for gallstones - discussed w pt.   Pt currently is symptom free. No pain. No nv.   Pt afebrile. Hr is currently 68, rr 14, bp 116/83, pulse ox 100%.  Pt currently appears stable for d/c.      Lajean Saver, MD 01/03/18 [phone]

## 2018-01-03 NOTE — Discharge Instructions (Addendum)
It was our pleasure to provide your ER care today - we hope that you feel better.  Your lab tests and ultrasound look good.   You may try taking pepcid and/or maalox as need for symptom relief.   Follow up with primary care doctor in the coming week.  Return to ER if worse, new symptoms, high fevers, new or severe pain, persistent vomiting, other concern.  You were given pain medication in the ER - no driving for the next 6 hours.

## 2018-02-06 DIAGNOSIS — R69 Illness, unspecified: Secondary | ICD-10-CM | POA: Diagnosis not present

## 2018-02-06 DIAGNOSIS — F411 Generalized anxiety disorder: Secondary | ICD-10-CM | POA: Diagnosis not present

## 2018-03-07 DIAGNOSIS — I10 Essential (primary) hypertension: Secondary | ICD-10-CM | POA: Diagnosis not present

## 2018-03-07 DIAGNOSIS — R7303 Prediabetes: Secondary | ICD-10-CM | POA: Diagnosis not present

## 2018-03-07 DIAGNOSIS — E782 Mixed hyperlipidemia: Secondary | ICD-10-CM | POA: Diagnosis not present

## 2018-03-12 ENCOUNTER — Ambulatory Visit
Admission: RE | Admit: 2018-03-12 | Discharge: 2018-03-12 | Disposition: A | Discharge status: Home / Self Care | Payer: 59 | Source: Ambulatory Visit | Attending: Advanced Practice Midwife | Admitting: Advanced Practice Midwife

## 2018-03-12 DIAGNOSIS — Z1231 Encounter for screening mammogram for malignant neoplasm of breast: Secondary | ICD-10-CM | POA: Diagnosis not present

## 2018-03-12 DIAGNOSIS — Z01419 Encounter for gynecological examination (general) (routine) without abnormal findings: Secondary | ICD-10-CM

## 2018-03-14 DIAGNOSIS — I10 Essential (primary) hypertension: Secondary | ICD-10-CM | POA: Diagnosis not present

## 2018-03-14 DIAGNOSIS — I517 Cardiomegaly: Secondary | ICD-10-CM | POA: Diagnosis not present

## 2018-03-26 DIAGNOSIS — I10 Essential (primary) hypertension: Secondary | ICD-10-CM | POA: Diagnosis not present

## 2018-03-26 DIAGNOSIS — M25512 Pain in left shoulder: Secondary | ICD-10-CM | POA: Diagnosis not present

## 2018-03-26 DIAGNOSIS — E782 Mixed hyperlipidemia: Secondary | ICD-10-CM | POA: Diagnosis not present

## 2018-03-26 DIAGNOSIS — R7303 Prediabetes: Secondary | ICD-10-CM | POA: Diagnosis not present

## 2018-04-11 DIAGNOSIS — F411 Generalized anxiety disorder: Secondary | ICD-10-CM | POA: Diagnosis not present

## 2018-04-11 DIAGNOSIS — R69 Illness, unspecified: Secondary | ICD-10-CM | POA: Diagnosis not present

## 2018-07-21 DIAGNOSIS — F411 Generalized anxiety disorder: Secondary | ICD-10-CM | POA: Diagnosis not present

## 2018-07-21 DIAGNOSIS — R69 Illness, unspecified: Secondary | ICD-10-CM | POA: Diagnosis not present

## 2018-08-01 DIAGNOSIS — I10 Essential (primary) hypertension: Secondary | ICD-10-CM | POA: Diagnosis not present

## 2018-08-01 DIAGNOSIS — M25512 Pain in left shoulder: Secondary | ICD-10-CM | POA: Diagnosis not present

## 2018-08-01 DIAGNOSIS — R202 Paresthesia of skin: Secondary | ICD-10-CM | POA: Diagnosis not present

## 2018-08-01 DIAGNOSIS — R7303 Prediabetes: Secondary | ICD-10-CM | POA: Diagnosis not present

## 2018-08-01 DIAGNOSIS — E782 Mixed hyperlipidemia: Secondary | ICD-10-CM | POA: Diagnosis not present

## 2018-08-27 DIAGNOSIS — M25512 Pain in left shoulder: Secondary | ICD-10-CM | POA: Diagnosis not present

## 2018-08-27 DIAGNOSIS — Z0000 Encounter for general adult medical examination without abnormal findings: Secondary | ICD-10-CM | POA: Diagnosis not present

## 2018-08-27 DIAGNOSIS — Z131 Encounter for screening for diabetes mellitus: Secondary | ICD-10-CM | POA: Diagnosis not present

## 2018-08-27 DIAGNOSIS — I10 Essential (primary) hypertension: Secondary | ICD-10-CM | POA: Diagnosis not present

## 2018-08-27 DIAGNOSIS — R7303 Prediabetes: Secondary | ICD-10-CM | POA: Diagnosis not present

## 2018-08-27 DIAGNOSIS — E782 Mixed hyperlipidemia: Secondary | ICD-10-CM | POA: Diagnosis not present

## 2018-08-27 DIAGNOSIS — Z0100 Encounter for examination of eyes and vision without abnormal findings: Secondary | ICD-10-CM | POA: Diagnosis not present

## 2018-08-27 DIAGNOSIS — R202 Paresthesia of skin: Secondary | ICD-10-CM | POA: Diagnosis not present

## 2018-08-27 DIAGNOSIS — Z136 Encounter for screening for cardiovascular disorders: Secondary | ICD-10-CM | POA: Diagnosis not present

## 2018-09-24 DIAGNOSIS — I10 Essential (primary) hypertension: Secondary | ICD-10-CM | POA: Diagnosis not present

## 2018-09-24 DIAGNOSIS — R202 Paresthesia of skin: Secondary | ICD-10-CM | POA: Diagnosis not present

## 2018-09-24 DIAGNOSIS — E782 Mixed hyperlipidemia: Secondary | ICD-10-CM | POA: Diagnosis not present

## 2018-09-24 DIAGNOSIS — M25512 Pain in left shoulder: Secondary | ICD-10-CM | POA: Diagnosis not present

## 2018-09-24 DIAGNOSIS — R7303 Prediabetes: Secondary | ICD-10-CM | POA: Diagnosis not present

## 2018-11-14 DIAGNOSIS — F411 Generalized anxiety disorder: Secondary | ICD-10-CM | POA: Diagnosis not present

## 2018-11-14 DIAGNOSIS — R69 Illness, unspecified: Secondary | ICD-10-CM | POA: Diagnosis not present

## 2018-12-17 ENCOUNTER — Encounter (HOSPITAL_COMMUNITY): Payer: Self-pay | Admitting: *Deleted

## 2018-12-17 ENCOUNTER — Emergency Department (HOSPITAL_COMMUNITY): Payer: 59

## 2018-12-17 ENCOUNTER — Other Ambulatory Visit: Payer: Self-pay

## 2018-12-17 DIAGNOSIS — R079 Chest pain, unspecified: Secondary | ICD-10-CM | POA: Insufficient documentation

## 2018-12-17 DIAGNOSIS — Z79899 Other long term (current) drug therapy: Secondary | ICD-10-CM | POA: Diagnosis not present

## 2018-12-17 DIAGNOSIS — J45909 Unspecified asthma, uncomplicated: Secondary | ICD-10-CM | POA: Insufficient documentation

## 2018-12-17 LAB — BASIC METABOLIC PANEL
Anion gap: 5 (ref 5–15)
BUN: 10 mg/dL (ref 6–20)
CO2: 25 mmol/L (ref 22–32)
Calcium: 8.8 mg/dL — ABNORMAL LOW (ref 8.9–10.3)
Chloride: 108 mmol/L (ref 98–111)
Creatinine, Ser: 0.82 mg/dL (ref 0.44–1.00)
GFR calc Af Amer: >60 mL/min (ref >60)
GFR calc non Af Amer: >60 mL/min (ref >60)
Glucose, Bld: 137 mg/dL — ABNORMAL HIGH (ref 70–99)
Potassium: 3.8 mmol/L (ref 3.5–5.1)
Sodium: 138 mmol/L (ref 135–145)

## 2018-12-17 LAB — I-STAT BETA HCG BLOOD, ED (MC, WL, AP ONLY): I-stat hCG, quantitative: <5 m[IU]/mL (ref <5)

## 2018-12-17 LAB — TROPONIN I (HIGH SENSITIVITY): Troponin I (High Sensitivity): 11 ng/L (ref <18)

## 2018-12-17 LAB — CBC
HCT: 44.2 % (ref 36.0–46.0)
Hemoglobin: 13.8 g/dL (ref 12.0–15.0)
MCH: 28.4 pg (ref 26.0–34.0)
MCHC: 31.2 g/dL (ref 30.0–36.0)
MCV: 90.9 fL (ref 80.0–100.0)
Platelets: 228 10*3/uL (ref 150–400)
RBC: 4.86 MIL/uL (ref 3.87–5.11)
RDW: 14.3 % (ref 11.5–15.5)
WBC: 7.6 10*3/uL (ref 4.0–10.5)
nRBC: 0 % (ref 0.0–0.2)

## 2018-12-17 MED ORDER — SODIUM CHLORIDE 0.9% FLUSH: 3.0000 mL | Freq: Once | INTRAVENOUS | Status: DC

## 2018-12-17 NOTE — ED Notes (Signed)
Accidentally clicked off chest xray

## 2018-12-17 NOTE — ED Triage Notes (Signed)
Pt states that she has had intermittent chest pain since Monday.  Pt states that it has gotten progressively worse. Pt's chest pain returned today at around 19:30 while pt was talking to her daughter.  Pt states that the pain is in her left central area and feels "heavy." At times, the pain is in her back.  Pt denies SOB, n/v and is not diaphoretic in triage. Pt a/o x 4.

## 2018-12-18 ENCOUNTER — Emergency Department (HOSPITAL_COMMUNITY)
Admission: EM | Admit: 2018-12-18 | Discharge: 2018-12-18 | Disposition: A | Discharge status: Home / Self Care | Payer: 59 | Attending: Emergency Medicine | Admitting: Emergency Medicine

## 2018-12-18 ENCOUNTER — Other Ambulatory Visit: Payer: Self-pay

## 2018-12-18 DIAGNOSIS — R079 Chest pain, unspecified: Secondary | ICD-10-CM

## 2018-12-18 LAB — TROPONIN I (HIGH SENSITIVITY): Troponin I (High Sensitivity): 13 ng/L (ref <18)

## 2018-12-18 MED ORDER — LIDOCAINE VISCOUS HCL 2 % MT SOLN
15.0000 mL | Freq: Once | OROMUCOSAL | Status: AC
  Administered 2018-12-18: 15 mL via ORAL
  Filled 2018-12-18: qty 15

## 2018-12-18 MED ORDER — ALUM & MAG HYDROXIDE-SIMETH 200-200-20 MG/5ML PO SUSP
30.0000 mL | Freq: Once | ORAL | Status: AC
  Administered 2018-12-18: 30 mL via ORAL
  Filled 2018-12-18: qty 30

## 2018-12-18 NOTE — ED Provider Notes (Signed)
[place] [facility] DEPT Provider Note   CSN: [id number] Arrival date & time: 12/17/18  2046     History   Chief Complaint Chief Complaint  Patient presents with  . Chest Pain    HPI Aaron Brison is a 48 y.o. female.     The history is provided by the patient and medical records.  Chest Pain   48 year old female with history of asthma, presenting to the ED with chest pain.  Patient reports this has been intermittent since Monday, comes and goes.  Pain is left sided and mid-sternal, sometimes a fullness/bubble and other times it feels "heavy".  Does report seems to get worse at night when trying to sleep, causes her to toss and turn.  She denies any shortness of breath, diaphoresis, nausea, vomiting, dizziness, palpitations, or feelings of syncope.  She has no known cardiac history.  No significant family cardiac history.  She is not a smoker.  No illicit drug use.  No history of DVT or PE.  She does have IUD in place.  Patient does report similar symptoms last year, was treated here in the ER with a GI cocktail which induced belching and had complete resolution of her symptoms.  States generally she does not have any significant issues with acid reflux.  She denies any dietary changes recently.  Past Medical History:  Diagnosis Date  . Asthma     Patient Active Problem List   Diagnosis Date Noted  . Encounter for IUD removal and reinsertion 01/11/2016    Past Surgical History:  Procedure Laterality Date  . TONSILLECTOMY    . TUBAL LIGATION       OB History   No obstetric history on file.      Home Medications    Prior to Admission medications   Medication Sig Start Date End Date Taking? Authorizing Provider  gabapentin (NEURONTIN) 300 MG capsule Take 300 mg by mouth daily.   Yes [provider]  hydrochlorothiazide (HYDRODIURIL) 25 MG tablet Take 25 mg by mouth daily.   Yes [provider]  levonorgestrel (MIRENA) 20  MCG/24HR IUD 1 each by Intrauterine route once. Every 5 years   Yes [provider]  meloxicam (MOBIC) 15 MG tablet Take 15 mg by mouth as needed for pain.  12/06/17  Yes [provider]  HYDROcodone-acetaminophen (NORCO/VICODIN) 5-325 MG tablet Take 2 tablets by mouth every 6 (six) hours as needed. Patient not taking: Reported on 12/18/2018 01/03/18   Virgel Manifold, MD  ondansetron (ZOFRAN) 4 MG tablet Take 1 tablet (4 mg total) by mouth every 6 (six) hours as needed for nausea or vomiting. Patient not taking: Reported on 12/18/2018 01/03/18   Virgel Manifold, MD    Family History Family History  Problem Relation Age of Onset  . Breast cancer Paternal Grandmother     Social History Social History   Tobacco Use  . Smoking status: Never Smoker  . Smokeless tobacco: Never Used  Substance Use Topics  . Alcohol use: Yes    Comment: occasionally   . Drug use: No     Allergies   Patient has no known allergies.   Review of Systems Review of Systems  Cardiovascular: Positive for chest pain.  All other systems reviewed and are negative.    Physical Exam Updated Vital Signs BP (!) 142/78 (BP Location: Left Arm)   Pulse 64   Temp 98.5 F (36.9 C) (Oral)   Resp 18   LMP 12/13/2018   SpO2  98%   Physical Exam Vitals signs and nursing note reviewed.  Constitutional:      Appearance: She is well-developed.  HENT:     Head: Normocephalic and atraumatic.  Eyes:     Conjunctiva/sclera: Conjunctivae normal.     Pupils: Pupils are equal, round, and reactive to light.  Neck:     Musculoskeletal: Normal range of motion.  Cardiovascular:     Rate and Rhythm: Normal rate and regular rhythm.     Heart sounds: Normal heart sounds.  Pulmonary:     Effort: Pulmonary effort is normal.     Breath sounds: Normal breath sounds. No decreased breath sounds, wheezing or rhonchi.     Comments: Lungs clear, no distress Chest:     Comments: Chest wall nontender  Abdominal:     General: Bowel sounds are normal.     Palpations: Abdomen is soft.  Musculoskeletal: Normal range of motion.  Skin:    General: Skin is warm and dry.  Neurological:     Mental Status: She is alert and oriented to person, place, and time.      ED Treatments / Results  Labs (all labs ordered are listed, but only abnormal results are displayed) Labs Reviewed  BASIC METABOLIC PANEL - Abnormal; Notable for the following components:      Result Value   Glucose, Bld 137 (*)    Calcium 8.8 (*)    All other components within normal limits  CBC  I-STAT BETA HCG BLOOD, ED ([facility], [facility], [facility] ONLY)  TROPONIN I (HIGH SENSITIVITY)  TROPONIN I (HIGH SENSITIVITY)    EKG None  Radiology Dg Chest 2 View  Result Date: 12/17/2018 CLINICAL DATA:  Chest pain EXAM: CHEST - 2 VIEW COMPARISON:  02/26/2017 FINDINGS: Heart size upper limits of normal. Both lungs are clear. The visualized skeletal structures are unremarkable. IMPRESSION: No active cardiopulmonary disease. Electronically Signed   By: Donavan Foil M.D.   On: 12/17/2018 22:03    Procedures Procedures (including critical care time)  Medications Ordered in ED Medications  sodium chloride flush (NS) 0.9 % injection 3 mL (has no administration in time range)  alum & mag hydroxide-simeth (MAALOX/MYLANTA) 200-200-20 MG/5ML suspension 30 mL (30 mLs Oral Given 12/18/18 0219)    And  lidocaine (XYLOCAINE) 2 % viscous mouth solution 15 mL (15 mLs Oral Given 12/18/18 0219)     Initial Impression / Assessment and Plan / ED Course  I have reviewed the triage vital signs and the nursing notes.  Pertinent labs & imaging results that were available during my care of the patient were reviewed by me and considered in my medical decision making (see chart for details).  48 year old female presenting to the ED with intermittent chest pain for the past 2 days.  States intermittently feels like fullness and sometimes feels like "heaviness  in her left chest and midsternal region.  She has no associated symptoms with this.  Does report it feels like prior episodes of gas and heartburn.  She is not tried any medications at home.  Her EKG here is sinus rhythm without acute ischemic changes.  Initial labs are reassuring, troponin negative.  Chest x-ray is clear.  She reports good relief with GI cocktail in the past so we will give dose of this and obtain delta troponin.  3:36 AM Patient feeling somewhat better after GI cocktail.  She has been able to rest here.  Her vitals remained stable.  Delta troponin remains negative.  Lower suspicion for  ACS, dissection, etc.  No unilateral leg swelling, tachycardia, or hypoxia to suggest DVT/PE.  Feel she is stable for discharge home.  Can continue over-the-counter antacids +/- gas-x if needed.  Recommended close follow-up with PCP.  She may return here for any new or acute changes.  Final Clinical Impressions(s) / ED Diagnoses   Final diagnoses:  Chest pain in adult    ED Discharge Orders    None       Larene Pickett, PA-C 12/18/18 [place], Delice Bison, DO 12/18/18 [phone]

## 2018-12-18 NOTE — Discharge Instructions (Signed)
Can use over-the-counter Maalox or Mylanta.  Gas-X may also be helpful. Recommended follow-up with your primary care doctor, copies of your labs attached on back for physician review. Return here for any new or acute changes.

## 2018-12-18 NOTE — ED Notes (Signed)
Pt was verbalized discharge instructions. Pt had no further questions at this time. NAD. 

## 2019-02-05 DIAGNOSIS — R69 Illness, unspecified: Secondary | ICD-10-CM | POA: Diagnosis not present

## 2019-02-05 DIAGNOSIS — F411 Generalized anxiety disorder: Secondary | ICD-10-CM | POA: Diagnosis not present

## 2019-03-16 ENCOUNTER — Other Ambulatory Visit: Payer: Self-pay | Admitting: Physician Assistant

## 2019-03-16 DIAGNOSIS — Z1231 Encounter for screening mammogram for malignant neoplasm of breast: Secondary | ICD-10-CM

## 2019-03-20 ENCOUNTER — Ambulatory Visit
Admission: RE | Admit: 2019-03-20 | Discharge: 2019-03-20 | Disposition: A | Discharge status: Home / Self Care | Payer: 59 | Source: Ambulatory Visit | Attending: Physician Assistant | Admitting: Physician Assistant

## 2019-03-20 ENCOUNTER — Other Ambulatory Visit: Payer: Self-pay

## 2019-03-20 DIAGNOSIS — Z1231 Encounter for screening mammogram for malignant neoplasm of breast: Secondary | ICD-10-CM | POA: Diagnosis not present

## 2019-03-25 DIAGNOSIS — Z03818 Encounter for observation for suspected exposure to other biological agents ruled out: Secondary | ICD-10-CM | POA: Diagnosis not present

## 2019-03-25 DIAGNOSIS — U071 COVID-19: Secondary | ICD-10-CM | POA: Diagnosis not present

## 2019-03-25 DIAGNOSIS — Z20828 Contact with and (suspected) exposure to other viral communicable diseases: Secondary | ICD-10-CM | POA: Diagnosis not present

## 2019-04-07 DIAGNOSIS — Z20828 Contact with and (suspected) exposure to other viral communicable diseases: Secondary | ICD-10-CM | POA: Diagnosis not present

## 2019-04-07 DIAGNOSIS — Z03818 Encounter for observation for suspected exposure to other biological agents ruled out: Secondary | ICD-10-CM | POA: Diagnosis not present

## 2019-09-24 DIAGNOSIS — E782 Mixed hyperlipidemia: Secondary | ICD-10-CM | POA: Diagnosis not present

## 2019-09-24 DIAGNOSIS — Z0000 Encounter for general adult medical examination without abnormal findings: Secondary | ICD-10-CM | POA: Diagnosis not present

## 2019-09-24 DIAGNOSIS — Z0110 Encounter for examination of ears and hearing without abnormal findings: Secondary | ICD-10-CM | POA: Diagnosis not present

## 2019-09-24 DIAGNOSIS — Z131 Encounter for screening for diabetes mellitus: Secondary | ICD-10-CM | POA: Diagnosis not present

## 2019-09-24 DIAGNOSIS — I10 Essential (primary) hypertension: Secondary | ICD-10-CM | POA: Diagnosis not present

## 2019-09-24 DIAGNOSIS — R7303 Prediabetes: Secondary | ICD-10-CM | POA: Diagnosis not present

## 2019-09-24 DIAGNOSIS — Z136 Encounter for screening for cardiovascular disorders: Secondary | ICD-10-CM | POA: Diagnosis not present

## 2019-09-24 DIAGNOSIS — Z0101 Encounter for examination of eyes and vision with abnormal findings: Secondary | ICD-10-CM | POA: Diagnosis not present

## 2019-09-24 DIAGNOSIS — R202 Paresthesia of skin: Secondary | ICD-10-CM | POA: Diagnosis not present

## 2019-09-24 DIAGNOSIS — R69 Illness, unspecified: Secondary | ICD-10-CM | POA: Diagnosis not present

## 2019-10-01 ENCOUNTER — Other Ambulatory Visit: Payer: Self-pay

## 2019-10-01 ENCOUNTER — Emergency Department (HOSPITAL_COMMUNITY): Payer: No Typology Code available for payment source

## 2019-10-01 ENCOUNTER — Emergency Department (HOSPITAL_COMMUNITY)
Admission: EM | Admit: 2019-10-01 | Discharge: 2019-10-02 | Disposition: A | Discharge status: Home / Self Care | Payer: No Typology Code available for payment source | Attending: Emergency Medicine | Admitting: Emergency Medicine

## 2019-10-01 ENCOUNTER — Encounter (HOSPITAL_COMMUNITY): Payer: Self-pay | Admitting: *Deleted

## 2019-10-01 DIAGNOSIS — R079 Chest pain, unspecified: Secondary | ICD-10-CM | POA: Insufficient documentation

## 2019-10-01 DIAGNOSIS — Z79899 Other long term (current) drug therapy: Secondary | ICD-10-CM | POA: Diagnosis not present

## 2019-10-01 DIAGNOSIS — J45909 Unspecified asthma, uncomplicated: Secondary | ICD-10-CM | POA: Insufficient documentation

## 2019-10-01 DIAGNOSIS — R0789 Other chest pain: Secondary | ICD-10-CM | POA: Diagnosis not present

## 2019-10-01 DIAGNOSIS — I10 Essential (primary) hypertension: Secondary | ICD-10-CM | POA: Diagnosis not present

## 2019-10-01 DIAGNOSIS — M549 Dorsalgia, unspecified: Secondary | ICD-10-CM | POA: Diagnosis not present

## 2019-10-01 HISTORY — DX: Essential (primary) hypertension: I10

## 2019-10-01 LAB — I-STAT BETA HCG BLOOD, ED (MC, WL, AP ONLY): I-stat hCG, quantitative: <5 m[IU]/mL (ref <5)

## 2019-10-01 LAB — BASIC METABOLIC PANEL
Anion gap: 9 (ref 5–15)
BUN: 9 mg/dL (ref 6–20)
CO2: 23 mmol/L (ref 22–32)
Calcium: 8.9 mg/dL (ref 8.9–10.3)
Chloride: 104 mmol/L (ref 98–111)
Creatinine, Ser: 1.09 mg/dL — ABNORMAL HIGH (ref 0.44–1.00)
GFR calc Af Amer: >60 mL/min (ref >60)
GFR calc non Af Amer: >60 mL/min (ref >60)
Glucose, Bld: 135 mg/dL — ABNORMAL HIGH (ref 70–99)
Potassium: 3.8 mmol/L (ref 3.5–5.1)
Sodium: 136 mmol/L (ref 135–145)

## 2019-10-01 LAB — CBC
HCT: 43 % (ref 36.0–46.0)
Hemoglobin: 13.6 g/dL (ref 12.0–15.0)
MCH: 28.5 pg (ref 26.0–34.0)
MCHC: 31.6 g/dL (ref 30.0–36.0)
MCV: 90.1 fL (ref 80.0–100.0)
Platelets: 180 10*3/uL (ref 150–400)
RBC: 4.77 MIL/uL (ref 3.87–5.11)
RDW: 13.6 % (ref 11.5–15.5)
WBC: 8.5 10*3/uL (ref 4.0–10.5)
nRBC: 0 % (ref 0.0–0.2)

## 2019-10-01 LAB — TROPONIN I (HIGH SENSITIVITY): Troponin I (High Sensitivity): 18 ng/L — ABNORMAL HIGH (ref <18)

## 2019-10-01 NOTE — ED Triage Notes (Addendum)
Pt from home c/o back and chest pain. Denies sob or injury, reports working from home. reports muscle pain to L side of neck, also denies any injury. Leaning to the side and holding her chest seems to improve pain

## 2019-10-02 LAB — TROPONIN I (HIGH SENSITIVITY): Troponin I (High Sensitivity): 17 ng/L (ref <18)

## 2019-10-02 MED ORDER — PANTOPRAZOLE SODIUM 20 MG PO TBEC
20.0000 mg | DELAYED_RELEASE_TABLET | Freq: Every day | ORAL | 0 refills | Status: DC
Start: 2019-10-02 — End: 2021-12-06

## 2019-10-02 NOTE — ED Provider Notes (Signed)
[facility] EMERGENCY DEPARTMENT Provider Note   CSN: [id number] Arrival date & time: 10/01/19  2151     History No chief complaint on file.   Glenda Lee is a 49 y.o. female.  The history is provided by the patient.  Chest Pain Pain location:  L chest Pain quality comment:  Heaviness Pain radiates to:  Does not radiate Pain severity:  Mild Onset quality:  Sudden Duration:  30 minutes Timing:  Rare Progression:  Partially resolved Chronicity:  New Relieved by:  Certain positions Worsened by:  Certain positions Ineffective treatments:  None tried Associated symptoms: back pain   Associated symptoms: no abdominal pain, no cough, no fever, no palpitations, no shortness of breath and no vomiting   Risk factors: hypertension and obesity        Past Medical History:  Diagnosis Date  . Asthma   . Hypertension     Patient Active Problem List   Diagnosis Date Noted  . Encounter for IUD removal and reinsertion 01/11/2016    Past Surgical History:  Procedure Laterality Date  . TONSILLECTOMY    . TUBAL LIGATION       OB History   No obstetric history on file.     Family History  Problem Relation Age of Onset  . Breast cancer Paternal Grandmother     Social History   Tobacco Use  . Smoking status: Never Smoker  . Smokeless tobacco: Never Used  Vaping Use  . Vaping Use: Never used  Substance Use Topics  . Alcohol use: Yes    Comment: occasionally   . Drug use: No    Home Medications Prior to Admission medications   Medication Sig Start Date End Date Taking? Authorizing Provider  amLODipine (NORVASC) 5 MG tablet Take 5 mg by mouth daily. 09/24/19  Yes [provider]  diclofenac Sodium (VOLTAREN) 1 % GEL Apply 2 g topically 4 (four) times daily.   Yes [provider]  levonorgestrel (MIRENA) 20 MCG/24HR IUD 1 each by Intrauterine route once. Every 5 years   Yes [provider]  meloxicam (MOBIC) 15 MG  tablet Take 15 mg by mouth as needed for pain.  12/06/17  Yes [provider]  metroNIDAZOLE (METROGEL) 0.75 % vaginal gel Place 1 Applicatorful vaginally at bedtime. 09/29/19  Yes [provider]  gabapentin (NEURONTIN) 300 MG capsule Take 300 mg by mouth daily. Patient not taking: Reported on 10/02/2019    [provider]  HYDROcodone-acetaminophen (NORCO/VICODIN) 5-325 MG tablet Take 2 tablets by mouth every 6 (six) hours as needed. Patient not taking: Reported on 12/18/2018 01/03/18   Virgel Manifold, MD  ondansetron (ZOFRAN) 4 MG tablet Take 1 tablet (4 mg total) by mouth every 6 (six) hours as needed for nausea or vomiting. Patient not taking: Reported on 12/18/2018 01/03/18   Virgel Manifold, MD  pantoprazole (PROTONIX) 20 MG tablet Take 1 tablet (20 mg total) by mouth daily. 10/02/19   Silvestre Gunner, MD    Allergies    Patient has no known allergies.  Review of Systems   Review of Systems  Constitutional: Negative for chills and fever.  HENT: Negative for ear pain and sore throat.   Eyes: Negative for pain and visual disturbance.  Respiratory: Negative for cough and shortness of breath.   Cardiovascular: Positive for chest pain. Negative for palpitations.  Gastrointestinal: Negative for abdominal pain and vomiting.  Genitourinary: Negative for dysuria and hematuria.  Musculoskeletal: Positive for back pain. Negative for arthralgias.  Skin: Negative for color change and rash.  Neurological: Negative for seizures and syncope.  All other systems reviewed and are negative.   Physical Exam Updated Vital Signs BP (!) 116/59 (BP Location: Right Arm)   Pulse (!) 58   Temp 98.6 F (37 C) (Oral)   Resp 18   LMP 08/10/2019   SpO2 98%   Physical Exam Vitals and nursing note reviewed.  Constitutional:      General: She is not in acute distress.    Appearance: She is well-developed.  HENT:     Head: Normocephalic and atraumatic.  Eyes:      Conjunctiva/sclera: Conjunctivae normal.  Cardiovascular:     Rate and Rhythm: Normal rate and regular rhythm.     Heart sounds: No murmur heard.   Pulmonary:     Effort: Pulmonary effort is normal. No respiratory distress.     Breath sounds: Normal breath sounds. No wheezing, rhonchi or rales.  Abdominal:     Palpations: Abdomen is soft.     Tenderness: There is no abdominal tenderness. There is no guarding or rebound.  Musculoskeletal:     Cervical back: Neck supple.  Skin:    General: Skin is warm and dry.  Neurological:     General: No focal deficit present.     Mental Status: She is alert and oriented to person, place, and time. Mental status is at baseline.     Cranial Nerves: No cranial nerve deficit.     Sensory: No sensory deficit.  Psychiatric:        Mood and Affect: Mood normal.        Behavior: Behavior normal.     ED Results / Procedures / Treatments   Labs (all labs ordered are listed, but only abnormal results are displayed) Labs Reviewed  BASIC METABOLIC PANEL - Abnormal; Notable for the following components:      Result Value   Glucose, Bld 135 (*)    Creatinine, Ser 1.09 (*)    All other components within normal limits  TROPONIN I (HIGH SENSITIVITY) - Abnormal; Notable for the following components:   Troponin I (High Sensitivity) 18 (*)    All other components within normal limits  CBC  I-STAT BETA HCG BLOOD, ED ([facility], [facility], [facility] ONLY)  TROPONIN I (HIGH SENSITIVITY)    EKG EKG Interpretation  Date/Time:  Thursday October 01 2019 22:19:35 EDT Ventricular Rate:  75 PR Interval:  152 QRS Duration: 92 QT Interval:  358 QTC Calculation: 399 R Axis:   8 Text Interpretation: Sinus rhythm with occasional Premature ventricular complexes Right atrial enlargement Nonspecific T wave abnormality Abnormal ECG When compared with ECG of 12/18/2018, Premature ventricular complexes are now present Confirmed by Delora Fuel ([id number]) on 10/01/2019 11:17:29  PM   Radiology DG Chest 2 View  Result Date: 10/01/2019 CLINICAL DATA:  Left-sided chest pain EXAM: CHEST - 2 VIEW COMPARISON:  12/17/2018 FINDINGS: The heart size and mediastinal contours are within normal limits. Both lungs are clear. The visualized skeletal structures are unremarkable. IMPRESSION: No active cardiopulmonary disease. Electronically Signed   By: Donavan Foil M.D.   On: 10/01/2019 23:27    Procedures Procedures (including critical care time)  Medications Ordered in ED Medications - No data to display  ED Course  I have reviewed the triage vital signs and the nursing notes.  Pertinent labs & imaging results that were available during my care of the patient were reviewed by me and considered in my medical decision making (  see chart for details).    MDM Rules/Calculators/A&P                          49 year old female with history of hypertension presents with chest pain.  Patient states that she had approximately 30 minutes of nonexertional chest "heaviness" underneath her left breast last night.  Patient stated that the pain was positional and associated with mild diaphoresis, back pain but denies any nausea, vomiting, neck pain, numbness, weakness, tingling.  Denies history of previous episodes.  States that the pain has resolved though she still feels "off".  Denies fever, chills, cough, shortness of breath, abdominal pain.  Afebrile vital signs able.  Exam as above.  No focal abnormalities on physical then.  No neurologic deficits though be concerning for acute dissection in this patient.  Chest x-ray showed no acute cardiopulmonary abnormality.  BMP showed a creatinine of 1.09 however patient has had creatinines in the high 0.8 so this may be a slight departure from her baseline but I do not believe reflects an AKI.  Remainder of BMP and CBC unremarkable.  Troponin 18-->17.  EKG showed sinus rhythm with occasional PVCs.  No focal signs of ischemia on EKG.  Hear score  3.  Given the patient had elevated troponin here pathway recommends the patient be observed.  Given that this troponin was obtained approximately 12 hours ago patient has not had any recurrent symptoms feel that this is an acceptable observation.  I feel that patient is stable for discharge at this time.  Low concern for ACS given borderline troponins, normal EKG, lack of recurrence of chest pain.  Etiology of patient's chest pain unclear at this time though may be MSK versus dyspepsia in nature.  After further discussions w/ the pt she states that she was treated for "gas" in the past.  Given this hx its possible that pt has a h/o dyspepsia and so we will send her home w/ a Rx for Protonix.  Patient states that her mother had coronary artery disease and she feels concerned about this so we will discharge patient with a referral to cardiology for follow-up.  This entire plan as well as ED return precautions were discussed with the patient and she voiced agreement and understanding the overall plan.  Patient was discharged in stable condition without further events.  Final Clinical Impression(s) / ED Diagnoses Final diagnoses:  Chest pain, unspecified type    Rx / DC Orders ED Discharge Orders         Ordered    pantoprazole (PROTONIX) 20 MG tablet  Daily     Discontinue  Reprint     10/02/19 1547    Ambulatory referral to Cardiology     Discontinue  Reprint     10/02/19 1547           Silvestre Gunner, MD 10/02/19 [place]    Drenda Freeze, MD 10/06/19 [phone]

## 2019-10-02 NOTE — ED Notes (Signed)
Called pt for vitals x2 for vitals, no response.

## 2019-10-02 NOTE — ED Notes (Signed)
Pt called for vitals; no response.  

## 2019-10-02 NOTE — Discharge Instructions (Addendum)
Please take Protonix once a day for your chest pain and please follow-up with cardiology.  They will contact you to set up an appointment however if you do not hear anything in the next few days please call the number attached to your discharge directions.  Please return to the emergency department for worsening chest pain or shortness of breath.

## 2019-10-07 NOTE — Progress Notes (Signed)
Referring-Cameron Eric Form MD Reason for referral-chest pain  HPI: 49 year old female for evaluation of chest pain at request of Silvestre Gunner, MD.  Patient seen with chest pain August 2021.  Troponins not consistent with acute coronary syndrome.  Hemoglobin 13.6.  Creatinine 1.09.  ECG October 01, 2019 showed sinus with PVC, right atrial enlargement and nonspecific ST changes.  Personally reviewed.  Symptoms felt possibly secondary to reflux.  Cardiology now asked to evaluate.  Patient states that on Monday her pain began she took amlodipine for the first time as well as a medication for infection.  She awoke and felt general malaise but also had chest heaviness/tightness/pressure.  The pain did not radiate though she did have some pain in her back.  She did have diaphoresis but no dyspnea or nausea.  The pain lasted approximately 1 to 1-1/2 hours and resolved.  She has had it intermittently since then.  It is not pleuritic, positional and there is no water brash associated.  It is not clearly exertional.  She denies dyspnea on exertion, orthopnea, PND, pedal edema or syncope.  Cardiology now asked to evaluate.  Current Outpatient Medications  Medication Sig Dispense Refill  . amLODipine (NORVASC) 5 MG tablet Take 5 mg by mouth daily.    . diclofenac Sodium (VOLTAREN) 1 % GEL Apply 2 g topically 4 (four) times daily.    Marland Kitchen gabapentin (NEURONTIN) 300 MG capsule Take 300 mg by mouth daily.     . hydrochlorothiazide (HYDRODIURIL) 25 MG tablet Take 25 mg by mouth daily.    Marland Kitchen HYDROcodone-acetaminophen (NORCO/VICODIN) 5-325 MG tablet Take 2 tablets by mouth every 6 (six) hours as needed. 12 tablet 0  . levonorgestrel (MIRENA) 20 MCG/24HR IUD 1 each by Intrauterine route once. Every 5 years    . meloxicam (MOBIC) 15 MG tablet Take 15 mg by mouth as needed for pain.     . pantoprazole (PROTONIX) 20 MG tablet Take 1 tablet (20 mg total) by mouth daily. 30 tablet 0   No current facility-administered  medications for this visit.    No Known Allergies   Past Medical History:  Diagnosis Date  . Asthma   . Hyperlipidemia   . Hypertension     Past Surgical History:  Procedure Laterality Date  . TONSILLECTOMY    . TUBAL LIGATION      Social History   Socioeconomic History  . Marital status: Single    Spouse name: Not on file  . Number of children: 3  . Years of education: Not on file  . Highest education level: Not on file  Occupational History    Comment: [employer]  Tobacco Use  . Smoking status: Never Smoker  . Smokeless tobacco: Never Used  Vaping Use  . Vaping Use: Never used  Substance and Sexual Activity  . Alcohol use: Yes    Comment: occasionally   . Drug use: No  . Sexual activity: Yes    Birth control/protection: Surgical  Other Topics Concern  . Not on file  Social History Narrative  . Not on file   Social Determinants of Health   Financial Resource Strain:   . Difficulty of Paying Living Expenses: Not on file  Food Insecurity:   . Worried About Charity fundraiser in the Last Year: Not on file  . Ran Out of Food in the Last Year: Not on file  Transportation Needs:   . Lack of Transportation (Medical): Not on file  . Lack of Transportation (Non-Medical):  Not on file  Physical Activity:   . Days of Exercise per Week: Not on file  . Minutes of Exercise per Session: Not on file  Stress:   . Feeling of Stress : Not on file  Social Connections:   . Frequency of Communication with Friends and Family: Not on file  . Frequency of Social Gatherings with Friends and Family: Not on file  . Attends Religious Services: Not on file  . Active Member of Clubs or Organizations: Not on file  . Attends Archivist Meetings: Not on file  . Marital Status: Not on file  Intimate Partner Violence:   . Fear of Current or Ex-Partner: Not on file  . Emotionally Abused: Not on file  . Physically Abused: Not on file  . Sexually Abused: Not on file    Family  History  Problem Relation Age of Onset  . Breast cancer Paternal Grandmother   . CAD Mother   . Pancreatic cancer Father   . Hypertension Sister     ROS: no fevers or chills, productive cough, hemoptysis, dysphasia, odynophagia, melena, hematochezia, dysuria, hematuria, rash, seizure activity, orthopnea, PND, pedal edema, claudication. Remaining systems are negative.  Physical Exam:   Blood pressure (!) 142/84, pulse (!) 57, height 5\' 7"  (1.702 m), weight (!) 310 lb (140.6 kg).  General:  Well developed/obese in NAD Skin warm/dry, tattoos noted Patient not depressed No peripheral clubbing Back-normal HEENT-normal/normal eyelids Neck supple/normal carotid upstroke bilaterally; no bruits; no JVD; no thyromegaly chest - CTA/ normal expansion CV - RRR/normal S1 and S2; no murmurs, rubs or gallops;  PMI nondisplaced Abdomen -NT/ND, no HSM, no mass, + bowel sounds, no bruit 2+ femoral pulses, no bruits Ext-no edema, chords, 2+ DP Neuro-grossly nonfocal  ECG - October 01, 2019 showed sinus with PVC, right atrial enlargement and nonspecific ST changes.  Personally reviewed.personally reviewed  Today's electrocardiogram shows sinus rhythm, nonspecific ST changes.  A/P  1 chest pain-symptoms are somewhat atypical but risk factors include hypertension and family history of coronary disease.  I will arrange a CTA to rule out obstructive coronary disease.  2 hypertension-patient's blood pressure is mildly elevated.  Follow blood pressure and adjust medications as needed.  Kirk Ruths, MD

## 2019-10-09 DIAGNOSIS — I10 Essential (primary) hypertension: Secondary | ICD-10-CM | POA: Diagnosis not present

## 2019-10-09 DIAGNOSIS — R202 Paresthesia of skin: Secondary | ICD-10-CM | POA: Diagnosis not present

## 2019-10-09 DIAGNOSIS — L700 Acne vulgaris: Secondary | ICD-10-CM | POA: Diagnosis not present

## 2019-10-09 DIAGNOSIS — Z8619 Personal history of other infectious and parasitic diseases: Secondary | ICD-10-CM | POA: Diagnosis not present

## 2019-10-09 DIAGNOSIS — R7303 Prediabetes: Secondary | ICD-10-CM | POA: Diagnosis not present

## 2019-10-09 DIAGNOSIS — E782 Mixed hyperlipidemia: Secondary | ICD-10-CM | POA: Diagnosis not present

## 2019-10-09 DIAGNOSIS — M25512 Pain in left shoulder: Secondary | ICD-10-CM | POA: Diagnosis not present

## 2019-10-13 ENCOUNTER — Ambulatory Visit (INDEPENDENT_AMBULATORY_CARE_PROVIDER_SITE_OTHER): Payer: No Typology Code available for payment source | Admitting: Cardiology

## 2019-10-13 ENCOUNTER — Other Ambulatory Visit: Payer: Self-pay

## 2019-10-13 ENCOUNTER — Encounter: Payer: Self-pay | Admitting: Cardiology

## 2019-10-13 VITALS — BP 142/84 | HR 57 | Ht 67.0 in | Wt 310.0 lb

## 2019-10-13 DIAGNOSIS — R072 Precordial pain: Secondary | ICD-10-CM

## 2019-10-13 DIAGNOSIS — I10 Essential (primary) hypertension: Secondary | ICD-10-CM | POA: Diagnosis not present

## 2019-10-13 MED ORDER — METOPROLOL TARTRATE 100 MG PO TABS
ORAL_TABLET | ORAL | 0 refills | Status: DC
Start: 2019-10-13 — End: 2021-12-06

## 2019-10-13 NOTE — Patient Instructions (Signed)
Medication Instructions:  NO CHANGE *If you need a refill on your cardiac medications before your next appointment, please call your pharmacy*   Lab Work: If you have labs (blood work) drawn today and your tests are completely normal, you will receive your results only by: Marland Kitchen MyChart Message (if you have MyChart) OR . A paper copy in the mail If you have any lab test that is abnormal or we need to change your treatment, we will call you to review the results.   Testing/Procedures:  Your cardiac CT will be scheduled at one of the below locations:   [facility] [address] [place], [place] [address] [phone]  If scheduled at [facility], please arrive at the [facility] main entrance of [facility] 30 minutes prior to test start time. Proceed to the [facility] Radiology Department (first floor) to check-in and test prep.  Please follow these instructions carefully (unless otherwise directed):  On the Night Before the Test: . Be sure to Drink plenty of water. . Do not consume any caffeinated/decaffeinated beverages or chocolate 12 hours prior to your test. . Do not take any antihistamines 12 hours prior to your test.  On the Day of the Test: . Drink plenty of water. Do not drink any water within one hour of the test. . Do not eat any food 4 hours prior to the test. . You may take your regular medications prior to the test.  . Take metoprolol (Lopressor) 100 MG two hours prior to test. . HOLD Furosemide/Hydrochlorothiazide morning of the test. . FEMALES- please wear underwire-free bra if available       After the Test: . Drink plenty of water. . After receiving IV contrast, you may experience a mild flushed feeling. This is normal. . On occasion, you may experience a mild rash up to 24 hours after the test. This is not dangerous. If this occurs, you can take Benadryl 25 mg and increase your fluid intake. . If you experience trouble  breathing, this can be serious. If it is severe call 911 IMMEDIATELY. If it is mild, please call our office. . If you take any of these medications: Glipizide/Metformin, Avandament, Glucavance, please do not take 48 hours after completing test unless otherwise instructed.   Once we have confirmed authorization from your insurance company, we will call you to set up a date and time for your test. Based on how quickly your insurance processes prior authorizations requests, please allow up to 4 weeks to be contacted for scheduling your Cardiac CT appointment. Be advised that routine Cardiac CT appointments could be scheduled as many as 8 weeks after your provider has ordered it.  For non-scheduling related questions, please contact the cardiac imaging nurse navigator should you have any questions/concerns: Marchia Bond, Cardiac Imaging Nurse Navigator Burley Saver, Interim Cardiac Imaging Nurse [facility] Direct Office Dial: [phone]   For scheduling needs, including cancellations and rescheduling, please call Vivien Rota at [phone], option 3.    Follow-Up: At [facility], you and your health needs are our priority.  As part of our continuing mission to provide you with exceptional heart care, we have created designated Provider Care Teams.  These Care Teams include your primary Cardiologist (physician) and Advanced Practice Providers (APPs -  Physician Assistants and Nurse Practitioners) who all work together to provide you with the care you need, when you need it.  We recommend signing up for the patient  portal called "MyChart".  Sign up information is provided on this After Visit Summary.  MyChart is used to connect with patients for Virtual Visits (Telemedicine).  Patients are able to view lab/test results, encounter notes, upcoming appointments, etc.  Non-urgent messages can be sent to your provider as well.   To learn more about what you can do with  MyChart, go to [email or web address].    Your next appointment:    AS NEEDED

## 2019-10-13 NOTE — Progress Notes (Signed)
After discussing patient's desire to lose weight during visit with Dr. Stanford Breed, a referral was made to family medicine for medical weight management. Patient is also interested in health coaching to achieve goals set forth to lose weight.  Care Guide will call patient on 10/14/19 to follow up on referral and setting up initial health coaching visit.

## 2019-10-15 ENCOUNTER — Encounter (INDEPENDENT_AMBULATORY_CARE_PROVIDER_SITE_OTHER): Payer: Self-pay

## 2019-10-30 ENCOUNTER — Telehealth: Payer: Self-pay

## 2019-10-30 DIAGNOSIS — Z0000 Encounter for general adult medical examination without abnormal findings: Secondary | ICD-10-CM

## 2019-10-30 NOTE — Telephone Encounter (Signed)
Called patient to follow up on referral to the [facility]. Patient was informed that [facility] attempted to contact her on 10/15/19 to schedule her initial appt. Patient was provided the [facility] contact information - [phone].  Patient stated that they will return the call today.

## 2019-12-03 ENCOUNTER — Telehealth (HOSPITAL_COMMUNITY): Payer: Self-pay | Admitting: *Deleted

## 2019-12-03 NOTE — Telephone Encounter (Signed)
Reaching out to patient to offer assistance regarding upcoming cardiac imaging study; pt verbalizes understanding of appt date/time, parking situation and where to check in, pre-test NPO status, and verified current allergies; name and call back number provided for further questions should they arise  [facility] [phone] office [phone] cell

## 2019-12-04 ENCOUNTER — Ambulatory Visit (HOSPITAL_COMMUNITY)
Admission: RE | Admit: 2019-12-04 | Discharge: 2019-12-04 | Disposition: A | Discharge status: Home / Self Care | Payer: No Typology Code available for payment source | Source: Ambulatory Visit | Attending: Cardiology | Admitting: Cardiology

## 2019-12-04 ENCOUNTER — Encounter (HOSPITAL_COMMUNITY): Payer: Self-pay

## 2019-12-04 DIAGNOSIS — R072 Precordial pain: Secondary | ICD-10-CM

## 2019-12-04 MED ORDER — NITROGLYCERIN 0.4 MG SL SUBL
SUBLINGUAL_TABLET | SUBLINGUAL | Status: AC
  Filled 2019-12-04: qty 2

## 2019-12-04 MED ORDER — IOHEXOL 350 MG/ML SOLN
80.0000 mL | Freq: Once | INTRAVENOUS | Status: AC | PRN
  Administered 2019-12-04: 80 mL via INTRAVENOUS

## 2019-12-04 MED ORDER — NITROGLYCERIN 0.4 MG SL SUBL
0.8000 mg | SUBLINGUAL_TABLET | Freq: Once | SUBLINGUAL | Status: AC
  Administered 2019-12-04: 0.8 mg via SUBLINGUAL

## 2019-12-04 NOTE — Discharge Instructions (Signed)
Cardiac CT Angiogram A cardiac CT angiogram is a procedure to look at the heart and the area around the heart. It may be done to help find the cause of chest pains or other symptoms of heart disease. During this procedure, a substance called contrast dye is injected into the blood vessels in the area to be checked. A large X-ray machine, called a CT scanner, then takes detailed pictures of the heart and the surrounding area. The procedure is also sometimes called a coronary CT angiogram, coronary artery scanning, or CTA. A cardiac CT angiogram allows the health care provider to see how well blood is flowing to and from the heart. The health care provider will be able to see if there are any problems, such as:  Blockage or narrowing of the coronary arteries in the heart.  Fluid around the heart.  Signs of weakness or disease in the muscles, valves, and tissues of the heart. Tell a health care provider about:  Any allergies you have. This is especially important if you have had a previous allergic reaction to contrast dye.  All medicines you are taking, including vitamins, herbs, eye drops, creams, and over-the-counter medicines.  Any blood disorders you have.  Any surgeries you have had.  Any medical conditions you have.  Whether you are pregnant or may be pregnant.  Any anxiety disorders, chronic pain, or other conditions you have that may increase your stress or prevent you from lying still. What are the risks? Generally, this is a safe procedure. However, problems may occur, including:  Bleeding.  Infection.  Allergic reactions to medicines or dyes.  Damage to other structures or organs.  Kidney damage from the contrast dye that is used.  Increased risk of cancer from radiation exposure. This risk is low. Talk with your health care provider about: ? The risks and benefits of testing. ? How you can receive the lowest dose of radiation. What happens before the  procedure?  Wear comfortable clothing and remove any jewelry, glasses, dentures, and hearing aids.  Follow instructions from your health care provider about eating and drinking. This may include: ? For 12 hours before the procedure -- avoid caffeine. This includes tea, coffee, soda, energy drinks, and diet pills. Drink plenty of water or other fluids that do not have caffeine in them. Being well hydrated can prevent complications. ? For 4-6 hours before the procedure -- stop eating and drinking. The contrast dye can cause nausea, but this is less likely if your stomach is empty.  Ask your health care provider about changing or stopping your regular medicines. This is especially important if you are taking diabetes medicines, blood thinners, or medicines to treat problems with erections (erectile dysfunction). What happens during the procedure?   Hair on your chest may need to be removed so that small sticky patches called electrodes can be placed on your chest. These will transmit information that helps to monitor your heart during the procedure.  An IV will be inserted into one of your veins.  You might be given a medicine to control your heart rate during the procedure. This will help to ensure that good images are obtained.  You will be asked to lie on an exam table. This table will slide in and out of the CT machine during the procedure.  Contrast dye will be injected into the IV. You might feel warm, or you may get a metallic taste in your mouth.  You will be given a medicine called   nitroglycerin. This will relax or dilate the arteries in your heart.  The table that you are lying on will move into the CT machine tunnel for the scan.  The person running the machine will give you instructions while the scans are being done. You may be asked to: ? Keep your arms above your head. ? Hold your breath. ? Stay very still, even if the table is moving.  When the scanning is complete, you  will be moved out of the machine.  The IV will be removed. The procedure may vary among health care providers and hospitals. What can I expect after the procedure? After your procedure, it is common to have:  A metallic taste in your mouth from the contrast dye.  A feeling of warmth.  A headache from the nitroglycerin. Follow these instructions at home:  Take over-the-counter and prescription medicines only as told by your health care provider.  If you are told, drink enough fluid to keep your urine pale yellow. This will help to flush the contrast dye out of your body.  Most people can return to their normal activities right after the procedure. Ask your health care provider what activities are safe for you.  It is up to you to get the results of your procedure. Ask your health care provider, or the department that is doing the procedure, when your results will be ready.  Keep all follow-up visits as told by your health care provider. This is important. Contact a health care provider if:  You have any symptoms of allergy to the contrast dye. These include: ? Shortness of breath. ? Rash or hives. ? A racing heartbeat. Summary  A cardiac CT angiogram is a procedure to look at the heart and the area around the heart. It may be done to help find the cause of chest pains or other symptoms of heart disease.  During this procedure, a large X-ray machine, called a CT scanner, takes detailed pictures of the heart and the surrounding area after a contrast dye has been injected into blood vessels in the area.  Ask your health care provider about changing or stopping your regular medicines before the procedure. This is especially important if you are taking diabetes medicines, blood thinners, or medicines to treat erectile dysfunction.  If you are told, drink enough fluid to keep your urine pale yellow. This will help to flush the contrast dye out of your body. This information is not  intended to replace advice given to you by your health care provider. Make sure you discuss any questions you have with your health care provider. Document Revised: 10/01/2018 Document Reviewed: 10/01/2018 Elsevier Patient Education  2020 Elsevier Inc. Testing With IV Contrast Material IV contrast material is a fluid that is used with some imaging tests. It is injected into your body through a vein. Contrast material is used when your health care providers need a detailed look at organs, tissues, or blood vessels that may not show up with the standard test. The material may be used when an X-ray, an MRI, a CT scan, or an ultrasound is done. IV contrast material may be used for imaging tests that check:  Muscles, skin, and fat.  Breasts.  Brain.  Digestive tract.  Heart.  Organs such as the liver, kidneys, lungs, bladder, and many others.  Arteries and veins. Tell a health care provider about:  Any allergies you have, especially an allergy to contrast material.  All medicines you are taking, including   metformin, beta blockers, NSAIDs (such as ibuprofen), interleukin-2, vitamins, herbs, eye drops, creams, and over-the-counter medicines.  Any problems you or family members have had with the use of contrast material.  Any blood disorders you have, such as sickle cell anemia.  Any surgeries you have had.  Any medical conditions you have or have had, especially alcohol abuse, dehydration, asthma, or kidney, liver, or heart problems.  Whether you are pregnant or may be pregnant.  Whether you are breastfeeding. Most contrast materials are safe for use in breastfeeding women. What are the risks? Generally, this is a safe procedure. However, problems may occur, including:  Headache.  Itching, skin rash, and hives.  Nausea and vomiting.  Allergic reactions.  Wheezing or difficulty breathing.  Abnormal heart rate.  Changes in blood pressure.  Throat swelling.  Kidney  damage. What happens before the procedure? Medicines Ask your health care provider about:  Changing or stopping your regular medicines. This is especially important if you are taking diabetes medicines or blood thinners.  Taking medicines such as aspirin and ibuprofen. These medicines can thin your blood. Do not take these medicines unless your health care provider tells you to take them.  Taking over-the-counter medicines, vitamins, herbs, and supplements. If you are at risk of having a reaction to the IV contrast material, you may be asked to take medicine before the procedure to prevent a reaction. General instructions  Follow instructions from your health care provider about eating or drinking restrictions.  You may have an exam or lab tests to make sure that you can safely get IV contrast material.  Ask if you will be given a medicine to help you relax (sedative) during the procedure. If so, plan to have someone take you home from the hospital or clinic. What happens during the procedure?  You may be given a sedative to help you relax.  An IV will be inserted into one of your veins.  Contrast material will be injected into your IV.  You may feel warmth or flushing as the contrast material enters your bloodstream.  You may have a metallic taste in your mouth for a few minutes.  The needle may cause some discomfort and bruising.  After the contrast material is in your body, the imaging test will be done. The procedure may vary among health care providers and hospitals. What can I expect after the procedure?  The IV will be removed.  You may be taken to a recovery area if sedation medicines were used. Your blood pressure, heart rate, breathing rate, and blood oxygen level will be monitored until you leave the hospital or clinic. Follow these instructions at home:   Take over-the-counter and prescription medicines only as told by your health care provider. ? Your health  care provider may tell you to not take certain medicines for a couple of days after the procedure. This is especially important if you are taking diabetes medicines.  If you are told, drink enough fluid to keep your urine pale yellow. This will help to remove the contrast material out of your body.  Do not drive for 24 hours if you were given a sedative during your procedure.  It is up to you to get the results of your procedure. Ask your health care provider, or the department that is doing the procedure, when your results will be ready.  Keep all follow-up visits as told by your health care provider. This is important. Contact a health care provider if:    You have redness, swelling, or pain near your IV site. Get help right away if:  You have an abnormal heart rhythm.  You have trouble breathing.  You have: ? Chest pain. ? Pain in your back, neck, arm, jaw, or stomach. ? Nausea or sweating. ? Hives or a rash.  You start shaking and cannot stop. These symptoms may represent a serious problem that is an emergency. Do not wait to see if the symptoms will go away. Get medical help right away. Call your local emergency services (911 in the [place] Do not drive yourself to the hospital. Summary  IV contrast material may be used for imaging tests to help your health care providers see your organs and tissues more clearly.  Tell your health care provider if you are pregnant or may be pregnant.  During the procedure, you may feel warmth or flushing as the contrast material enters your bloodstream.  After the procedure, drink enough fluid to keep your urine pale yellow. This information is not intended to replace advice given to you by your health care provider. Make sure you discuss any questions you have with your health care provider. Document Revised: 04/24/2018 Document Reviewed: 04/24/2018 Elsevier Patient Education  2020 Elsevier Inc.  

## 2020-03-11 ENCOUNTER — Encounter (INDEPENDENT_AMBULATORY_CARE_PROVIDER_SITE_OTHER): Payer: Self-pay

## 2020-04-25 DIAGNOSIS — E782 Mixed hyperlipidemia: Secondary | ICD-10-CM | POA: Diagnosis not present

## 2020-04-25 DIAGNOSIS — H1031 Unspecified acute conjunctivitis, right eye: Secondary | ICD-10-CM | POA: Diagnosis not present

## 2020-04-25 DIAGNOSIS — I10 Essential (primary) hypertension: Secondary | ICD-10-CM | POA: Diagnosis not present

## 2020-04-25 DIAGNOSIS — M25512 Pain in left shoulder: Secondary | ICD-10-CM | POA: Diagnosis not present

## 2020-04-25 DIAGNOSIS — R202 Paresthesia of skin: Secondary | ICD-10-CM | POA: Diagnosis not present

## 2020-04-25 DIAGNOSIS — R7303 Prediabetes: Secondary | ICD-10-CM | POA: Diagnosis not present

## 2020-05-02 DIAGNOSIS — M25512 Pain in left shoulder: Secondary | ICD-10-CM | POA: Diagnosis not present

## 2020-05-02 DIAGNOSIS — R202 Paresthesia of skin: Secondary | ICD-10-CM | POA: Diagnosis not present

## 2020-05-02 DIAGNOSIS — E782 Mixed hyperlipidemia: Secondary | ICD-10-CM | POA: Diagnosis not present

## 2020-05-02 DIAGNOSIS — R7303 Prediabetes: Secondary | ICD-10-CM | POA: Diagnosis not present

## 2020-05-02 DIAGNOSIS — I10 Essential (primary) hypertension: Secondary | ICD-10-CM | POA: Diagnosis not present

## 2020-05-10 ENCOUNTER — Other Ambulatory Visit: Payer: Self-pay | Admitting: Physician Assistant

## 2020-05-10 DIAGNOSIS — Z1231 Encounter for screening mammogram for malignant neoplasm of breast: Secondary | ICD-10-CM

## 2020-05-30 DIAGNOSIS — E782 Mixed hyperlipidemia: Secondary | ICD-10-CM | POA: Diagnosis not present

## 2020-05-30 DIAGNOSIS — J302 Other seasonal allergic rhinitis: Secondary | ICD-10-CM | POA: Diagnosis not present

## 2020-05-30 DIAGNOSIS — M25512 Pain in left shoulder: Secondary | ICD-10-CM | POA: Diagnosis not present

## 2020-05-30 DIAGNOSIS — I10 Essential (primary) hypertension: Secondary | ICD-10-CM | POA: Diagnosis not present

## 2020-05-30 DIAGNOSIS — R7303 Prediabetes: Secondary | ICD-10-CM | POA: Diagnosis not present

## 2020-05-30 DIAGNOSIS — R202 Paresthesia of skin: Secondary | ICD-10-CM | POA: Diagnosis not present

## 2020-07-01 ENCOUNTER — Ambulatory Visit: Payer: No Typology Code available for payment source

## 2020-07-13 DIAGNOSIS — E782 Mixed hyperlipidemia: Secondary | ICD-10-CM | POA: Diagnosis not present

## 2020-07-13 DIAGNOSIS — I10 Essential (primary) hypertension: Secondary | ICD-10-CM | POA: Diagnosis not present

## 2020-07-13 DIAGNOSIS — R202 Paresthesia of skin: Secondary | ICD-10-CM | POA: Diagnosis not present

## 2020-07-13 DIAGNOSIS — R7303 Prediabetes: Secondary | ICD-10-CM | POA: Diagnosis not present

## 2020-07-13 DIAGNOSIS — M25512 Pain in left shoulder: Secondary | ICD-10-CM | POA: Diagnosis not present

## 2020-07-13 DIAGNOSIS — J302 Other seasonal allergic rhinitis: Secondary | ICD-10-CM | POA: Diagnosis not present

## 2020-08-01 ENCOUNTER — Other Ambulatory Visit: Payer: Self-pay | Admitting: Physician Assistant

## 2020-08-01 DIAGNOSIS — Z1231 Encounter for screening mammogram for malignant neoplasm of breast: Secondary | ICD-10-CM

## 2020-08-03 ENCOUNTER — Ambulatory Visit
Admission: RE | Admit: 2020-08-03 | Discharge: 2020-08-03 | Disposition: A | Discharge status: Home / Self Care | Payer: 59 | Source: Ambulatory Visit | Attending: Physician Assistant | Admitting: Physician Assistant

## 2020-08-03 ENCOUNTER — Other Ambulatory Visit: Payer: Self-pay

## 2020-08-03 DIAGNOSIS — Z1231 Encounter for screening mammogram for malignant neoplasm of breast: Secondary | ICD-10-CM | POA: Diagnosis not present

## 2020-09-06 DIAGNOSIS — M5412 Radiculopathy, cervical region: Secondary | ICD-10-CM | POA: Diagnosis not present

## 2020-09-06 DIAGNOSIS — M9901 Segmental and somatic dysfunction of cervical region: Secondary | ICD-10-CM | POA: Diagnosis not present

## 2020-09-06 DIAGNOSIS — M62830 Muscle spasm of back: Secondary | ICD-10-CM | POA: Diagnosis not present

## 2020-11-07 ENCOUNTER — Other Ambulatory Visit: Payer: Self-pay

## 2020-11-07 ENCOUNTER — Other Ambulatory Visit (HOSPITAL_COMMUNITY)
Admission: RE | Admit: 2020-11-07 | Discharge: 2020-11-07 | Disposition: A | Discharge status: Home / Self Care | Payer: 59 | Source: Ambulatory Visit | Attending: Student | Admitting: Student

## 2020-11-07 ENCOUNTER — Ambulatory Visit (INDEPENDENT_AMBULATORY_CARE_PROVIDER_SITE_OTHER): Payer: 59 | Admitting: Student

## 2020-11-07 VITALS — BP 169/92 | HR 57 | Ht 67.5 in | Wt 317.7 lb

## 2020-11-07 DIAGNOSIS — Z124 Encounter for screening for malignant neoplasm of cervix: Secondary | ICD-10-CM

## 2020-11-07 DIAGNOSIS — I10 Essential (primary) hypertension: Secondary | ICD-10-CM | POA: Diagnosis not present

## 2020-11-07 DIAGNOSIS — T8332XA Displacement of intrauterine contraceptive device, initial encounter: Secondary | ICD-10-CM

## 2020-11-07 DIAGNOSIS — R202 Paresthesia of skin: Secondary | ICD-10-CM | POA: Diagnosis not present

## 2020-11-07 DIAGNOSIS — E782 Mixed hyperlipidemia: Secondary | ICD-10-CM | POA: Diagnosis not present

## 2020-11-07 DIAGNOSIS — Z0000 Encounter for general adult medical examination without abnormal findings: Secondary | ICD-10-CM | POA: Diagnosis not present

## 2020-11-07 DIAGNOSIS — B9689 Other specified bacterial agents as the cause of diseases classified elsewhere: Secondary | ICD-10-CM | POA: Diagnosis not present

## 2020-11-07 DIAGNOSIS — N760 Acute vaginitis: Secondary | ICD-10-CM | POA: Diagnosis not present

## 2020-11-07 DIAGNOSIS — Z01419 Encounter for gynecological examination (general) (routine) without abnormal findings: Secondary | ICD-10-CM | POA: Diagnosis not present

## 2020-11-07 DIAGNOSIS — R7303 Prediabetes: Secondary | ICD-10-CM | POA: Diagnosis not present

## 2020-11-07 DIAGNOSIS — M25512 Pain in left shoulder: Secondary | ICD-10-CM | POA: Diagnosis not present

## 2020-11-07 DIAGNOSIS — Z1159 Encounter for screening for other viral diseases: Secondary | ICD-10-CM | POA: Diagnosis not present

## 2020-11-07 DIAGNOSIS — J302 Other seasonal allergic rhinitis: Secondary | ICD-10-CM | POA: Diagnosis not present

## 2020-11-07 NOTE — Progress Notes (Signed)
GYNECOLOGY CLINIC ANNUAL PREVENTATIVE CARE ENCOUNTER NOTE  Subjective:   Glenda Lee is a 50 y.o. No obstetric history on file. female here for a routine annual gynecologic exam & IUD removal.  Current complaints: feels like she's going through the "change". Reports recent increase in mood swings & hot flashes. Has had mirena in place for the last 5 years & would like it removed to see is menses has ceased. Mirena was placed for AUB, she previously had BTL for contraception. She has a PCP who she sees regularly, has appointment with her PCP later this morning at 1130.  Denies abnormal vaginal bleeding, discharge, pelvic pain, problems with intercourse or other gynecologic concerns.    Gynecologic History No LMP recorded. Patient is premenopausal. Contraception: tubal ligation Last Pap: 2019. Results were: normal Last mammogram: 07/2020. Results were: normal  Obstetric History OB History  No obstetric history on file.    Past Medical History:  Diagnosis Date   Asthma    Hyperlipidemia    Hypertension     Past Surgical History:  Procedure Laterality Date   TONSILLECTOMY     TUBAL LIGATION      Current Outpatient Medications on File Prior to Visit  Medication Sig Dispense Refill   diclofenac Sodium (VOLTAREN) 1 % GEL Apply 2 g topically 4 (four) times daily.     HYDROcodone-acetaminophen (NORCO/VICODIN) 5-325 MG tablet Take 2 tablets by mouth every 6 (six) hours as needed. 12 tablet 0   levonorgestrel (MIRENA) 20 MCG/24HR IUD 1 each by Intrauterine route once. Every 5 years     pantoprazole (PROTONIX) 20 MG tablet Take 1 tablet (20 mg total) by mouth daily. 30 tablet 0   amLODipine (NORVASC) 5 MG tablet Take 5 mg by mouth daily. (Patient not taking: Reported on 11/07/2020)     gabapentin (NEURONTIN) 300 MG capsule Take 300 mg by mouth daily.  (Patient not taking: Reported on 11/07/2020)     hydrochlorothiazide (HYDRODIURIL) 25 MG tablet Take 25 mg by mouth daily. (Patient not  taking: Reported on 11/07/2020)     meloxicam (MOBIC) 15 MG tablet Take 15 mg by mouth as needed for pain.  (Patient not taking: Reported on 11/07/2020)     metoprolol tartrate (LOPRESSOR) 100 MG tablet TAKE 2 HOURS PRIOR TO CT SCAN (Patient not taking: Reported on 11/07/2020) 1 tablet 0   No current facility-administered medications on file prior to visit.    No Known Allergies  Social History   Socioeconomic History   Marital status: Single    Spouse name: Not on file   Number of children: 3   Years of education: Not on file   Highest education level: Not on file  Occupational History    Comment: [employer]  Tobacco Use   Smoking status: Never   Smokeless tobacco: Never  Vaping Use   Vaping Use: Never used  Substance and Sexual Activity   Alcohol use: Yes    Comment: occasionally    Drug use: No   Sexual activity: Yes    Birth control/protection: Surgical  Other Topics Concern   Not on file  Social History Narrative   Not on file   Social Determinants of Health   Financial Resource Strain: Not on file  Food Insecurity: Not on file  Transportation Needs: Not on file  Physical Activity: Not on file  Stress: Not on file  Social Connections: Not on file  Intimate Partner Violence: Not on file    Family History  Problem  Relation Age of Onset   Breast cancer Paternal Grandmother    CAD Mother    Pancreatic cancer Father    Hypertension Sister     The following portions of the patient's history were reviewed and updated as appropriate: allergies, current medications, past family history, past medical history, past social history, past surgical history and problem list.  Review of Systems Pertinent items are noted in HPI.   Objective:  BP (!) 169/92   Pulse (!) 57   Ht 5' 7.5" (1.715 m)   Wt (!) 317 lb 11.2 oz (144.1 kg)   BMI 49.02 kg/m  CONSTITUTIONAL: Well-developed, well-nourished female in no acute distress.  HENT:  Normocephalic, atraumatic, External right  and left ear normal. Oropharynx is clear and moist EYES: Conjunctivae and EOM are normal. Pupils are equal, round, and reactive to light. No scleral icterus.  NECK: Normal range of motion, supple, no masses.  Normal thyroid.  SKIN: Skin is warm and dry. No rash noted. Not diaphoretic. No erythema. No pallor. PSYCHIATRIC: Normal mood and affect. Normal behavior. Normal judgment and thought content. CARDIOVASCULAR: Normal heart rate noted, regular rhythm RESPIRATORY: Clear to auscultation bilaterally. Effort and breath sounds normal, no problems with respiration noted. ABDOMEN: Soft, normal bowel sounds, no distention noted.  No tenderness, rebound or guarding.  PELVIC: Cervix pink/smooth, no lesions. NEFG. Pap collected. IUD strings not visualized. Unable to remove strings from cervix using a cytobrush or kelly clamps.  MUSCULOSKELETAL: Normal range of motion. No tenderness.  No cyanosis, clubbing, or edema.  2+ distal pulses.   Assessment:  Annual gynecologic examination with pap smear   Plan:   1. Women's annual routine gynecological examination -pap smear colleted -reviewed normal mammogram from 07/2020 -Patient has appointment with her PCP later this morning. States they are supposed to discuss scheduling a colonoscopy  -BP elevated this morning. Asymptomatic. Reports she has been treated for hypertension in the past x 1 month. She will address BP with her PCP.   2. Screening for cervical cancer  - Cytology - PAP( [place])  3. Intrauterine contraceptive device threads lost, initial encounter -unable to remove IUD as strings could not be located nor grasped. Will get ultrasound for IUD placement.  - US Pelvis Complete; Future      Jorje Guild, NP

## 2020-11-09 LAB — CYTOLOGY - PAP
Adequacy: ABSENT
Comment: NEGATIVE
Diagnosis: NEGATIVE
High risk HPV: NEGATIVE

## 2020-11-09 MED ORDER — METRONIDAZOLE 500 MG PO TABS: 500.0000 mg | ORAL_TABLET | Freq: Two times a day (BID) | ORAL | 0 refills | Status: DC

## 2020-11-09 NOTE — Addendum Note (Signed)
Addended by: Jorje Guild B on: 11/09/2020 03:32 PM   Modules accepted: Orders

## 2020-11-10 DIAGNOSIS — R202 Paresthesia of skin: Secondary | ICD-10-CM | POA: Diagnosis not present

## 2020-11-10 DIAGNOSIS — E010 Iodine-deficiency related diffuse (endemic) goiter: Secondary | ICD-10-CM | POA: Diagnosis not present

## 2020-11-10 DIAGNOSIS — E782 Mixed hyperlipidemia: Secondary | ICD-10-CM | POA: Diagnosis not present

## 2020-11-10 DIAGNOSIS — R7303 Prediabetes: Secondary | ICD-10-CM | POA: Diagnosis not present

## 2020-11-10 DIAGNOSIS — M25512 Pain in left shoulder: Secondary | ICD-10-CM | POA: Diagnosis not present

## 2020-11-10 DIAGNOSIS — J302 Other seasonal allergic rhinitis: Secondary | ICD-10-CM | POA: Diagnosis not present

## 2020-11-10 DIAGNOSIS — I10 Essential (primary) hypertension: Secondary | ICD-10-CM | POA: Diagnosis not present

## 2020-11-14 ENCOUNTER — Ambulatory Visit
Admission: RE | Admit: 2020-11-14 | Discharge: 2020-11-14 | Disposition: A | Discharge status: Home / Self Care | Payer: 59 | Source: Ambulatory Visit | Attending: Student | Admitting: Student

## 2020-11-14 ENCOUNTER — Other Ambulatory Visit: Payer: Self-pay

## 2020-11-14 DIAGNOSIS — T8332XA Displacement of intrauterine contraceptive device, initial encounter: Secondary | ICD-10-CM

## 2020-11-14 DIAGNOSIS — D259 Leiomyoma of uterus, unspecified: Secondary | ICD-10-CM | POA: Diagnosis not present

## 2020-11-15 ENCOUNTER — Telehealth: Payer: Self-pay | Admitting: Student

## 2020-11-15 NOTE — Telephone Encounter (Signed)
Patient called and verified her identity via birth date.  Patient agreeable to results via phone and was informed of ultrasound report.   IUD in place, also informed of fibroids.  Discussed with patient that mirena has now been approved for 8 years and she could keep it in place until she is through menopause. Patient would still like it removed at this point. Message sent to the office to schedule appointment with MD for removal.     Jorje Guild, NP

## 2020-11-19 ENCOUNTER — Encounter: Payer: Self-pay | Admitting: Radiology

## 2020-11-29 ENCOUNTER — Telehealth: Payer: Self-pay | Admitting: Radiology

## 2020-11-29 NOTE — Telephone Encounter (Signed)
Called patient to schedule IUD removal with an attending at [facility]. Unable to leave voicemail. Sent [employer].

## 2020-12-14 ENCOUNTER — Encounter (HOSPITAL_COMMUNITY): Payer: Self-pay | Admitting: Radiology

## 2021-06-08 DIAGNOSIS — R202 Paresthesia of skin: Secondary | ICD-10-CM | POA: Diagnosis not present

## 2021-06-08 DIAGNOSIS — R7303 Prediabetes: Secondary | ICD-10-CM | POA: Diagnosis not present

## 2021-06-08 DIAGNOSIS — Z113 Encounter for screening for infections with a predominantly sexual mode of transmission: Secondary | ICD-10-CM | POA: Diagnosis not present

## 2021-06-08 DIAGNOSIS — R69 Illness, unspecified: Secondary | ICD-10-CM | POA: Diagnosis not present

## 2021-06-08 DIAGNOSIS — E782 Mixed hyperlipidemia: Secondary | ICD-10-CM | POA: Diagnosis not present

## 2021-06-08 DIAGNOSIS — M25512 Pain in left shoulder: Secondary | ICD-10-CM | POA: Diagnosis not present

## 2021-06-08 DIAGNOSIS — J302 Other seasonal allergic rhinitis: Secondary | ICD-10-CM | POA: Diagnosis not present

## 2021-06-08 DIAGNOSIS — I10 Essential (primary) hypertension: Secondary | ICD-10-CM | POA: Diagnosis not present

## 2021-06-21 DIAGNOSIS — R221 Localized swelling, mass and lump, neck: Secondary | ICD-10-CM | POA: Diagnosis not present

## 2021-06-21 DIAGNOSIS — E782 Mixed hyperlipidemia: Secondary | ICD-10-CM | POA: Diagnosis not present

## 2021-06-21 DIAGNOSIS — Z6841 Body Mass Index (BMI) 40.0 and over, adult: Secondary | ICD-10-CM | POA: Diagnosis not present

## 2021-06-21 DIAGNOSIS — I10 Essential (primary) hypertension: Secondary | ICD-10-CM | POA: Diagnosis not present

## 2021-06-22 ENCOUNTER — Other Ambulatory Visit (HOSPITAL_BASED_OUTPATIENT_CLINIC_OR_DEPARTMENT_OTHER): Payer: Self-pay | Admitting: Internal Medicine

## 2021-06-22 DIAGNOSIS — R221 Localized swelling, mass and lump, neck: Secondary | ICD-10-CM

## 2021-08-23 ENCOUNTER — Other Ambulatory Visit: Payer: Self-pay

## 2021-08-23 ENCOUNTER — Encounter (HOSPITAL_COMMUNITY): Payer: Self-pay | Admitting: *Deleted

## 2021-08-23 ENCOUNTER — Emergency Department (HOSPITAL_COMMUNITY)
Admission: EM | Admit: 2021-08-23 | Discharge: 2021-08-24 | Disposition: A | Discharge status: Home / Self Care | Payer: 59 | Attending: Emergency Medicine | Admitting: Emergency Medicine

## 2021-08-23 DIAGNOSIS — R109 Unspecified abdominal pain: Secondary | ICD-10-CM | POA: Diagnosis not present

## 2021-08-23 DIAGNOSIS — M545 Low back pain, unspecified: Secondary | ICD-10-CM | POA: Diagnosis not present

## 2021-08-23 DIAGNOSIS — M5416 Radiculopathy, lumbar region: Secondary | ICD-10-CM | POA: Diagnosis not present

## 2021-08-23 DIAGNOSIS — M5410 Radiculopathy, site unspecified: Secondary | ICD-10-CM

## 2021-08-23 LAB — COMPREHENSIVE METABOLIC PANEL
ALT: 31 U/L (ref 0–44)
AST: 25 U/L (ref 15–41)
Albumin: 3.6 g/dL (ref 3.5–5.0)
Alkaline Phosphatase: 108 U/L (ref 38–126)
Anion gap: 8 (ref 5–15)
BUN: 10 mg/dL (ref 6–20)
CO2: 21 mmol/L — ABNORMAL LOW (ref 22–32)
Calcium: 8.6 mg/dL — ABNORMAL LOW (ref 8.9–10.3)
Chloride: 109 mmol/L (ref 98–111)
Creatinine, Ser: 0.85 mg/dL (ref 0.44–1.00)
GFR, Estimated: >60 mL/min (ref >60)
Glucose, Bld: 129 mg/dL — ABNORMAL HIGH (ref 70–99)
Potassium: 3.7 mmol/L (ref 3.5–5.1)
Sodium: 138 mmol/L (ref 135–145)
Total Bilirubin: 0.4 mg/dL (ref 0.3–1.2)
Total Protein: 6.8 g/dL (ref 6.5–8.1)

## 2021-08-23 LAB — URINALYSIS, ROUTINE W REFLEX MICROSCOPIC
Bilirubin Urine: NEGATIVE
Glucose, UA: NEGATIVE mg/dL
Hgb urine dipstick: NEGATIVE
Ketones, ur: NEGATIVE mg/dL
Leukocytes,Ua: NEGATIVE
Nitrite: NEGATIVE
Protein, ur: 30 mg/dL — AB
Specific Gravity, Urine: 1.029 (ref 1.005–1.030)
pH: 5 (ref 5.0–8.0)

## 2021-08-23 LAB — CBC WITH DIFFERENTIAL/PLATELET
Abs Immature Granulocytes: 0.03 10*3/uL (ref 0.00–0.07)
Basophils Absolute: 0.1 10*3/uL (ref 0.0–0.1)
Basophils Relative: 1 %
Eosinophils Absolute: 0.3 10*3/uL (ref 0.0–0.5)
Eosinophils Relative: 4 %
HCT: 43.2 % (ref 36.0–46.0)
Hemoglobin: 13.6 g/dL (ref 12.0–15.0)
Immature Granulocytes: 0 %
Lymphocytes Relative: 51 %
Lymphs Abs: 4.2 10*3/uL — ABNORMAL HIGH (ref 0.7–4.0)
MCH: 28.6 pg (ref 26.0–34.0)
MCHC: 31.5 g/dL (ref 30.0–36.0)
MCV: 90.9 fL (ref 80.0–100.0)
Monocytes Absolute: 0.5 10*3/uL (ref 0.1–1.0)
Monocytes Relative: 6 %
Neutro Abs: 3.2 10*3/uL (ref 1.7–7.7)
Neutrophils Relative %: 38 %
Platelets: 187 10*3/uL (ref 150–400)
RBC: 4.75 MIL/uL (ref 3.87–5.11)
RDW: 13.9 % (ref 11.5–15.5)
WBC: 8.3 10*3/uL (ref 4.0–10.5)
nRBC: 0 % (ref 0.0–0.2)

## 2021-08-23 LAB — LIPASE, BLOOD: Lipase: 27 U/L (ref 11–51)

## 2021-08-23 MED ORDER — KETOROLAC TROMETHAMINE 15 MG/ML IJ SOLN
15.0000 mg | Freq: Once | INTRAMUSCULAR | Status: AC
  Administered 2021-08-24: 15 mg via INTRAMUSCULAR
  Filled 2021-08-23: qty 1

## 2021-08-23 MED ORDER — METHOCARBAMOL 500 MG PO TABS: 500.0000 mg | ORAL_TABLET | Freq: Two times a day (BID) | ORAL | 0 refills | Status: DC

## 2021-08-23 MED ORDER — ACETAMINOPHEN 500 MG PO TABS
1000.0000 mg | ORAL_TABLET | Freq: Once | ORAL | Status: AC
  Administered 2021-08-24: 1000 mg via ORAL
  Filled 2021-08-23: qty 2

## 2021-08-23 NOTE — ED Provider Triage Note (Signed)
Emergency Medicine Provider Triage Evaluation Note  Glenda Lee , a 51 y.o. female  was evaluated in triage.  Pt complains of right-sided back pain and flank pain.  Intermittent symptoms for 2 to 3 days.  No urinary symptoms or fever.  No changes to bowel movements, vomiting.  Pain is worse with certain movements and reaching.  Took ibuprofen with only minimal improvement in symptoms.  Review of Systems  Positive: Right-sided back pain or flank pain Negative: Above  Physical Exam  BP (!) 155/73   Pulse (!) 55   Temp 98.4 F (36.9 C)   Resp 18   Ht '5\' 7"'$  (1.702 m)   Wt (!) 144.1 kg   SpO2 100%   BMI 49.76 kg/m  Gen:   Awake, no distress   Resp:  Normal effort  MSK:   Moves extremities without difficulty  Other:  Right-sided CVA tenderness  Medical Decision Making  Medically screening exam initiated at 4:49 PM.  Appropriate orders placed.  Zona Pedro was informed that the remainder of the evaluation will be completed by another provider, this initial triage assessment does not replace that evaluation, and the importance of remaining in the ED until their evaluation is complete.  Labs and urinalysis ordered   Delia Heady, PA-C 08/23/21 1650

## 2021-08-23 NOTE — ED Provider Notes (Signed)
[facility] EMERGENCY DEPARTMENT Provider Note   CSN: [id number] Arrival date & time: 08/23/21  1634     History  Chief Complaint  Patient presents with   Flank Pain    Glenda Lee is a 51 y.o. female.  51 yo F with a chief complaints of right-sided low back pain.  This radiates around a little bit into the abdomen.  Denies any nausea or vomiting denies fevers or chills.  Worse with movement palpation and twisting ambulation.  Denies loss of bowel or bladder denies also partial sensation denies trauma denies fevers.   Flank Pain       Home Medications Prior to Admission medications   Medication Sig Start Date End Date Taking? Authorizing Provider  methocarbamol (ROBAXIN) 500 MG tablet Take 1 tablet (500 mg total) by mouth 2 (two) times daily. 08/23/21  Yes Deno Etienne, DO  amLODipine (NORVASC) 5 MG tablet Take 5 mg by mouth daily. Patient not taking: Reported on 11/07/2020 09/24/19   [provider]  diclofenac Sodium (VOLTAREN) 1 % GEL Apply 2 g topically 4 (four) times daily.    [provider]  gabapentin (NEURONTIN) 300 MG capsule Take 300 mg by mouth daily.  Patient not taking: Reported on 11/07/2020    [provider]  hydrochlorothiazide (HYDRODIURIL) 25 MG tablet Take 25 mg by mouth daily. Patient not taking: Reported on 11/07/2020 10/09/19   [provider]  HYDROcodone-acetaminophen (NORCO/VICODIN) 5-325 MG tablet Take 2 tablets by mouth every 6 (six) hours as needed. 01/03/18   Virgel Manifold, MD  levonorgestrel (MIRENA) 20 MCG/24HR IUD 1 each by Intrauterine route once. Every 5 years    [provider]  meloxicam (MOBIC) 15 MG tablet Take 15 mg by mouth as needed for pain.  Patient not taking: Reported on 11/07/2020 12/06/17   [provider]  metoprolol tartrate (LOPRESSOR) 100 MG tablet TAKE 2 HOURS PRIOR TO CT SCAN Patient not taking: Reported on 11/07/2020 10/13/19   Lelon Perla, MD   metroNIDAZOLE (FLAGYL) 500 MG tablet Take 1 tablet (500 mg total) by mouth 2 (two) times daily. 11/09/20   Jorje Guild, NP  pantoprazole (PROTONIX) 20 MG tablet Take 1 tablet (20 mg total) by mouth daily. 10/02/19   Silvestre Gunner, MD      Allergies    Patient has no known allergies.    Review of Systems   Review of Systems  Genitourinary:  Positive for flank pain.    Physical Exam Updated Vital Signs BP (!) 142/81 (BP Location: Left Arm)   Pulse (!) 59   Temp 98.4 F (36.9 C)   Resp 17   Ht '5\' 7"'$  (1.702 m)   Wt (!) 144.1 kg   SpO2 100%   BMI 49.76 kg/m  Physical Exam Vitals and nursing note reviewed.  Constitutional:      General: She is not in acute distress.    Appearance: She is well-developed. She is not diaphoretic.  HENT:     Head: Normocephalic and atraumatic.  Eyes:     Pupils: Pupils are equal, round, and reactive to light.  Cardiovascular:     Rate and Rhythm: Normal rate and regular rhythm.     Heart sounds: No murmur heard.    No friction rub. No gallop.  Pulmonary:     Effort: Pulmonary effort is normal.     Breath sounds: No wheezing or rales.  Abdominal:     General: There is no distension.  Palpations: Abdomen is soft.     Tenderness: There is no abdominal tenderness.  Musculoskeletal:        General: No tenderness.     Cervical back: Normal range of motion and neck supple.     Comments: Points to the right paraspinal musculature about the lower T-spine and upper L-spine.  No rash.  No abdominal discomfort.  Pulse motor and sensation intact bilateral lower extremities.  Reflexes are 2+ and equal.  No clonus.  Skin:    General: Skin is warm and dry.  Neurological:     Mental Status: She is alert and oriented to person, place, and time.  Psychiatric:        Behavior: Behavior normal.     ED Results / Procedures / Treatments   Labs (all labs ordered are listed, but only abnormal results are displayed) Labs Reviewed  COMPREHENSIVE  METABOLIC PANEL - Abnormal; Notable for the following components:      Result Value   CO2 21 (*)    Glucose, Bld 129 (*)    Calcium 8.6 (*)    All other components within normal limits  CBC WITH DIFFERENTIAL/PLATELET - Abnormal; Notable for the following components:   Lymphs Abs 4.2 (*)    All other components within normal limits  URINALYSIS, ROUTINE W REFLEX MICROSCOPIC - Abnormal; Notable for the following components:   APPearance CLOUDY (*)    Protein, ur 30 (*)    Bacteria, UA RARE (*)    All other components within normal limits  LIPASE, BLOOD    EKG None  Radiology No results found.  Procedures Procedures    Medications Ordered in ED Medications  acetaminophen (TYLENOL) tablet 1,000 mg (has no administration in time range)  ketorolac (TORADOL) 15 MG/ML injection 15 mg (has no administration in time range)    ED Course/ Medical Decision Making/ A&P                           Medical Decision Making Risk OTC drugs. Prescription drug management.   51 yo F with a chief complaints of right flank pain.  By history and physical this is most likely radicular back pain.  She is well-appearing and nontoxic.  Her UA is independently interpreted by me without infection.  She has no leukocytosis no anemia no significant electrolyte abnormality.  We will treat as musculoskeletal back pain.  PCP follow-up.  11:43 PM:  I have discussed the diagnosis/risks/treatment options with the patient.  Evaluation and diagnostic testing in the emergency department does not suggest an emergent condition requiring admission or immediate intervention beyond what has been performed at this time.  They will follow up with  PCP. We also discussed returning to the ED immediately if new or worsening sx occur. We discussed the sx which are most concerning (e.g., sudden worsening pain, fever, inability to tolerate by mouth, cauda equina sx) that necessitate immediate return. Medications administered to  the patient during their visit and any new prescriptions provided to the patient are listed below.  Medications given during this visit Medications  acetaminophen (TYLENOL) tablet 1,000 mg (has no administration in time range)  ketorolac (TORADOL) 15 MG/ML injection 15 mg (has no administration in time range)     The patient appears reasonably screen and/or stabilized for discharge and I doubt any other medical condition or other [facility] requiring further screening, evaluation, or treatment in the ED at this time prior to discharge.  Final Clinical Impression(s) / ED Diagnoses Final diagnoses:  Radicular low back pain    Rx / DC Orders ED Discharge Orders          Ordered    methocarbamol (ROBAXIN) 500 MG tablet  2 times daily        08/23/21 [place], [place], DO 08/23/21 2343

## 2021-08-23 NOTE — Discharge Instructions (Addendum)

## 2021-08-23 NOTE — ED Triage Notes (Signed)
The pt is c/o flank pain  for 2-3 days  no bloody urine no known injury some urinary frequency  lmp [place]

## 2021-09-27 ENCOUNTER — Encounter (INDEPENDENT_AMBULATORY_CARE_PROVIDER_SITE_OTHER): Payer: Self-pay

## 2021-10-04 ENCOUNTER — Encounter: Payer: Self-pay | Admitting: Internal Medicine

## 2021-10-07 ENCOUNTER — Ambulatory Visit (HOSPITAL_BASED_OUTPATIENT_CLINIC_OR_DEPARTMENT_OTHER)
Admission: RE | Admit: 2021-10-07 | Discharge: 2021-10-07 | Disposition: A | Discharge status: Home / Self Care | Payer: 59 | Source: Ambulatory Visit | Attending: Internal Medicine | Admitting: Internal Medicine

## 2021-10-07 DIAGNOSIS — R221 Localized swelling, mass and lump, neck: Secondary | ICD-10-CM | POA: Diagnosis not present

## 2021-10-07 DIAGNOSIS — E041 Nontoxic single thyroid nodule: Secondary | ICD-10-CM | POA: Diagnosis not present

## 2021-10-12 DIAGNOSIS — E041 Nontoxic single thyroid nodule: Secondary | ICD-10-CM | POA: Diagnosis not present

## 2021-10-24 ENCOUNTER — Ambulatory Visit: Payer: 59

## 2021-10-24 NOTE — Progress Notes (Unsigned)
Patient in for colonoscopy pre visit appointment.  Current weight 328 lbs  ( 50.6 BMI)  Today's appointment cancelled and patient scheduled for new patient appt with Dr Lorenso Courier

## 2021-10-31 DIAGNOSIS — E041 Nontoxic single thyroid nodule: Secondary | ICD-10-CM | POA: Diagnosis not present

## 2021-11-02 IMAGING — MG MM DIGITAL SCREENING BILAT W/ TOMO AND CAD
6 of 10 series · 6 of 30 positions shown · non-contrast
Comparison: Previous exam(s).

CLINICAL DATA: Screening.

EXAM:
DIGITAL SCREENING BILATERAL MAMMOGRAM WITH TOMOSYNTHESIS AND CAD
TECHNIQUE: Bilateral screening digital craniocaudal and mediolateral oblique
mammograms were obtained. Bilateral screening digital breast
tomosynthesis was performed. The images were evaluated with
computer-aided detection.

[L CC synth-2D]
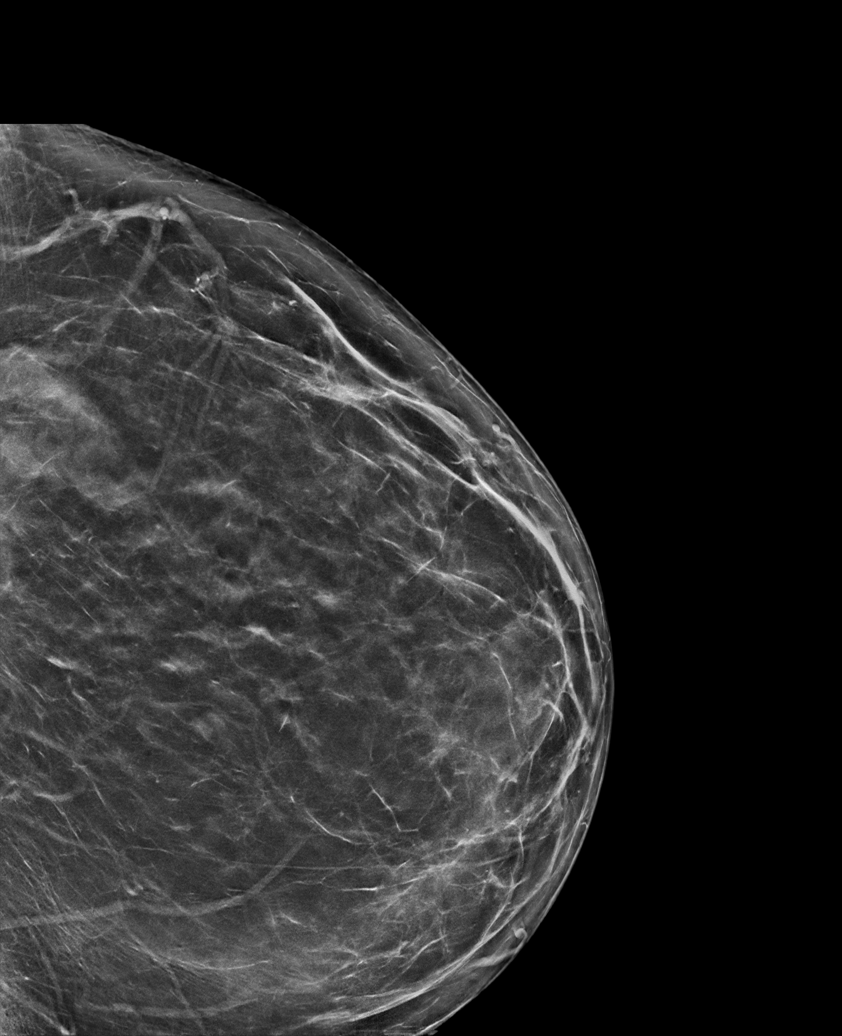

[L CV synth-2D]
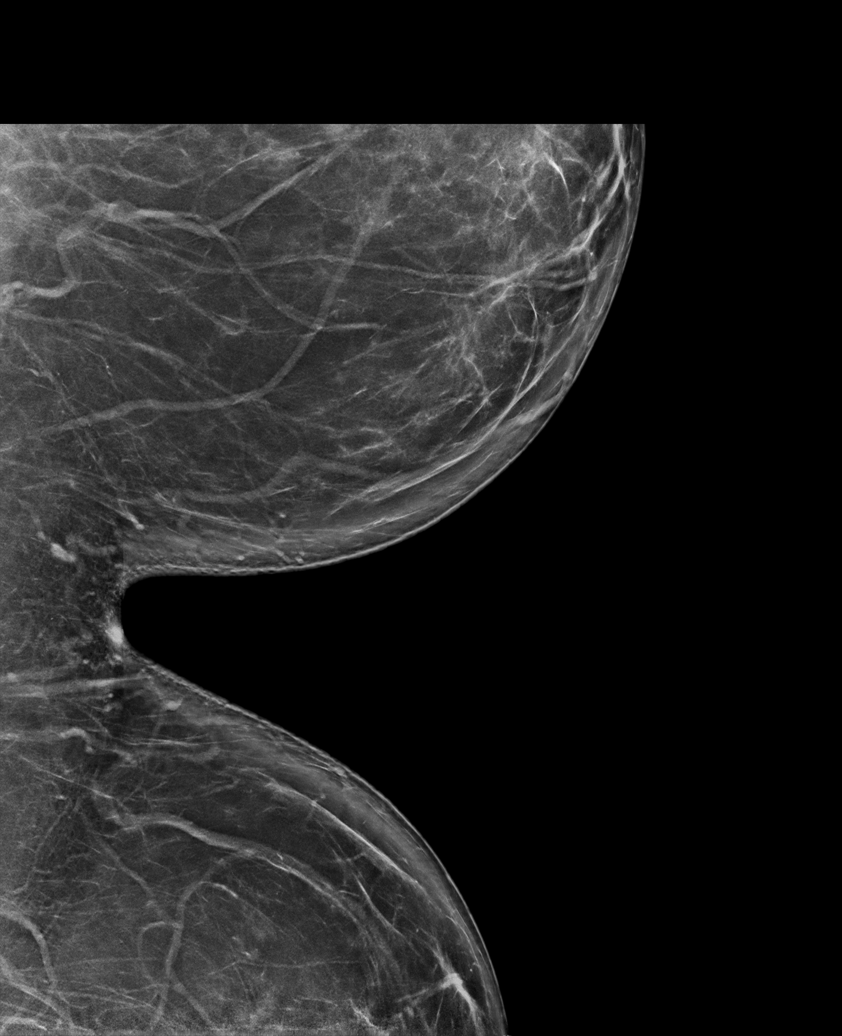

[R CC synth-2D]
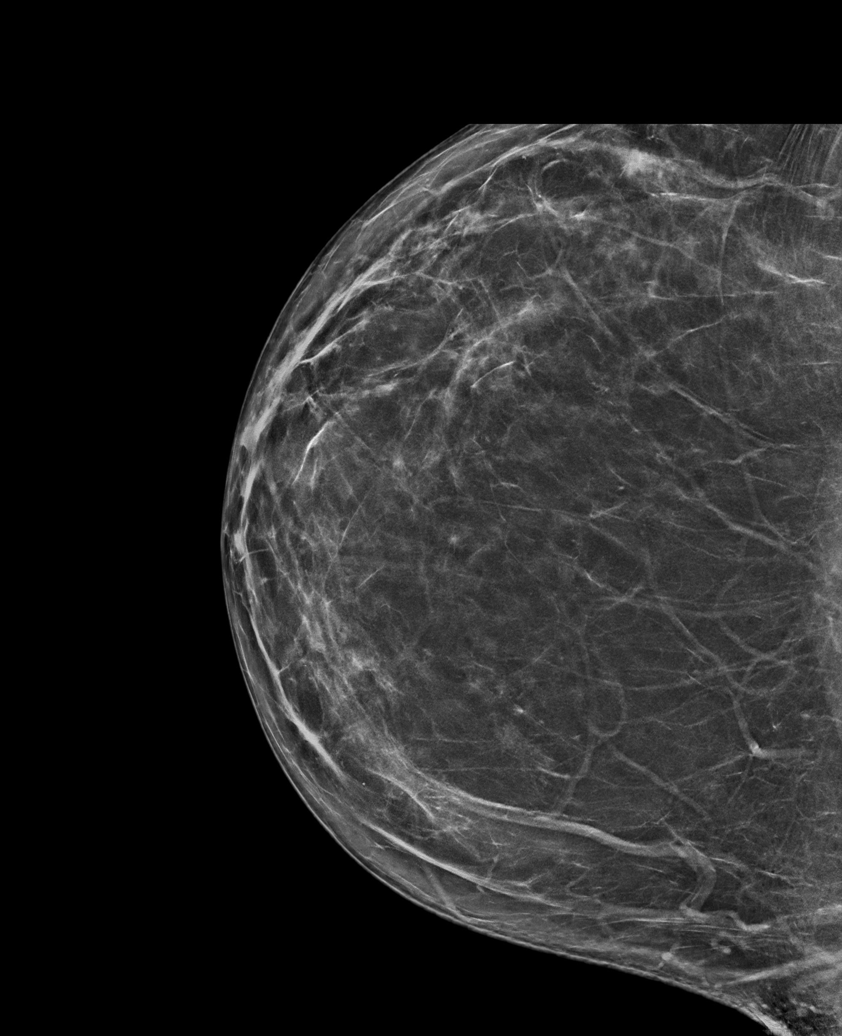

[R MLO synth-2D]
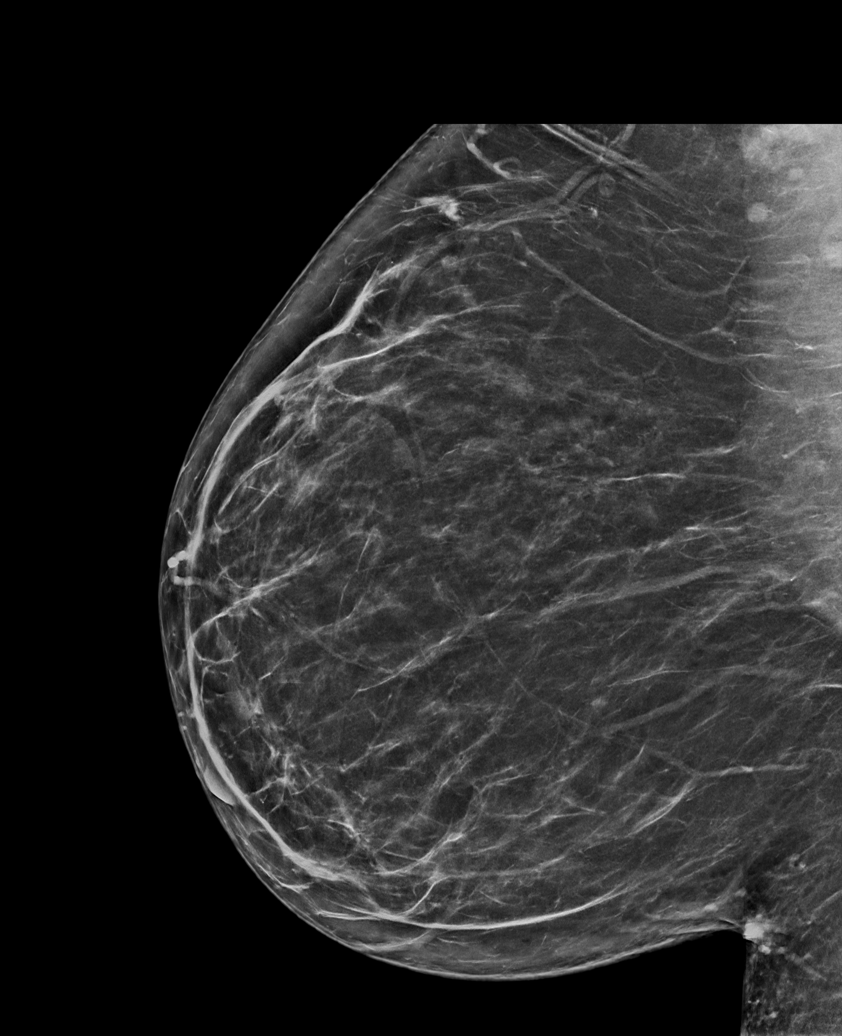

[L MLO synth-2D]
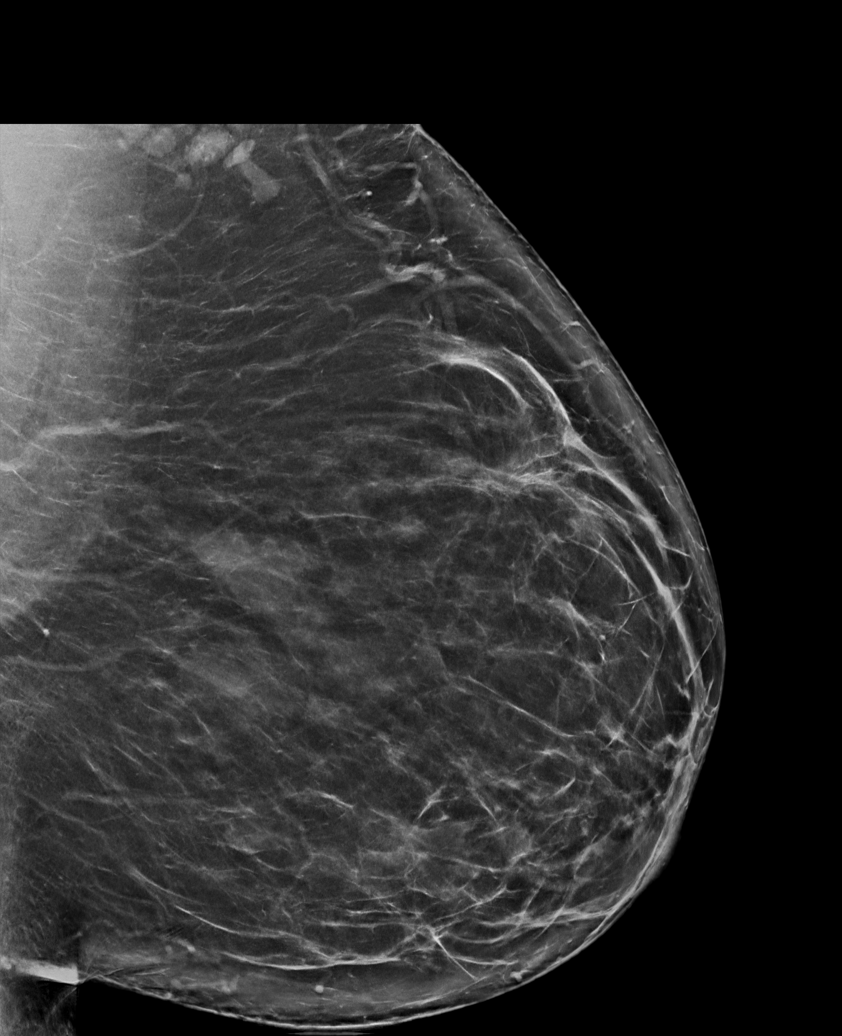

[L CV tomo · tomo slice 45/89.0]
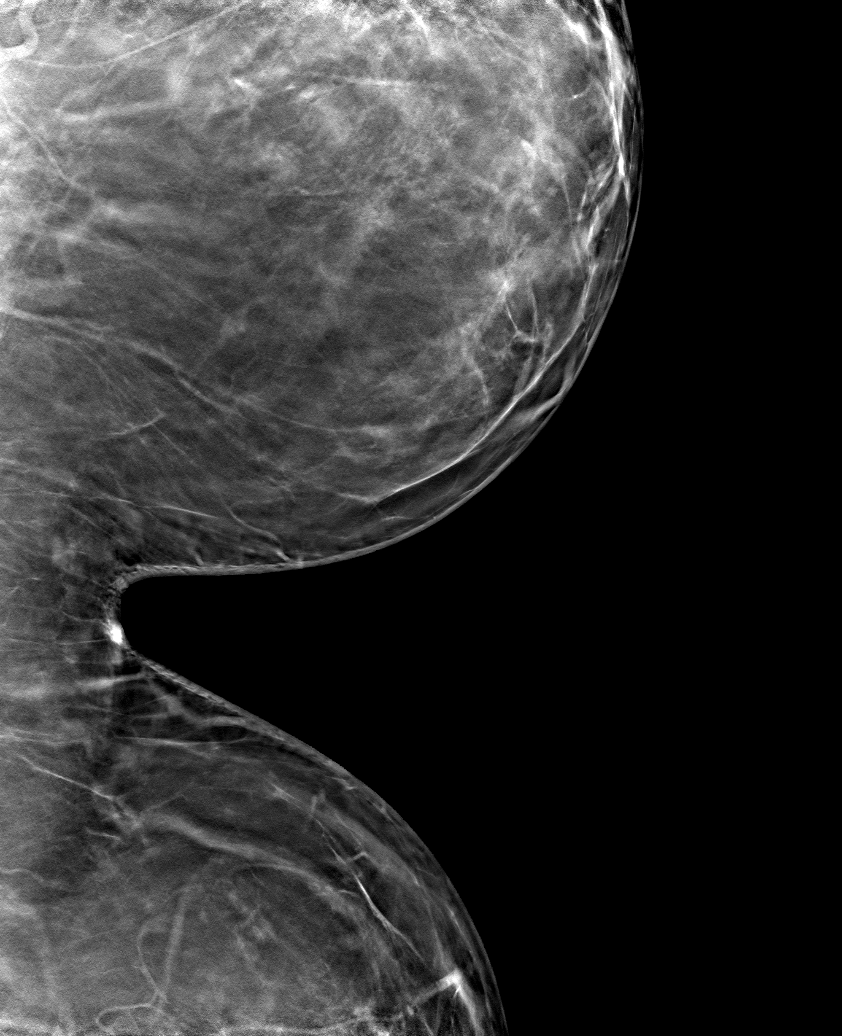

[6 of 30 positions shown; findings below may reference images not displayed]

ACR Breast Density Category b: There are scattered areas of
fibroglandular density.
FINDINGS: There are no findings suspicious for malignancy.
IMPRESSION: No mammographic evidence of malignancy. A result letter of this
screening mammogram will be mailed directly to the patient.

RECOMMENDATION:
Screening mammogram in one year. (Code:51-O-LD2)

BI-RADS CATEGORY  1: Negative.

## 2021-11-07 ENCOUNTER — Encounter: Payer: Self-pay | Admitting: *Deleted

## 2021-11-07 NOTE — Telephone Encounter (Signed)
OPEN IN ERROR 

## 2021-11-08 ENCOUNTER — Other Ambulatory Visit: Payer: Self-pay | Admitting: Otolaryngology

## 2021-11-08 DIAGNOSIS — E041 Nontoxic single thyroid nodule: Secondary | ICD-10-CM

## 2021-11-15 ENCOUNTER — Encounter: Payer: 59 | Admitting: Internal Medicine

## 2021-11-15 DIAGNOSIS — N83202 Unspecified ovarian cyst, left side: Secondary | ICD-10-CM | POA: Diagnosis not present

## 2021-11-15 DIAGNOSIS — I10 Essential (primary) hypertension: Secondary | ICD-10-CM | POA: Diagnosis not present

## 2021-11-15 DIAGNOSIS — T8332XS Displacement of intrauterine contraceptive device, sequela: Secondary | ICD-10-CM | POA: Diagnosis not present

## 2021-11-15 DIAGNOSIS — R232 Flushing: Secondary | ICD-10-CM | POA: Diagnosis not present

## 2021-11-21 DIAGNOSIS — N83202 Unspecified ovarian cyst, left side: Secondary | ICD-10-CM | POA: Diagnosis not present

## 2021-11-28 ENCOUNTER — Telehealth: Payer: Self-pay | Admitting: Family Medicine

## 2021-11-28 DIAGNOSIS — Z30432 Encounter for removal of intrauterine contraceptive device: Secondary | ICD-10-CM | POA: Diagnosis not present

## 2021-11-28 DIAGNOSIS — T8332XS Displacement of intrauterine contraceptive device, sequela: Secondary | ICD-10-CM | POA: Diagnosis not present

## 2021-11-28 DIAGNOSIS — N9489 Other specified conditions associated with female genital organs and menstrual cycle: Secondary | ICD-10-CM | POA: Diagnosis not present

## 2021-11-28 NOTE — Telephone Encounter (Signed)
Called patient to schedule iud removal for December, there was no answer to the phone call so a voicemail was left with the call back number for the office.

## 2021-12-06 ENCOUNTER — Encounter (HOSPITAL_COMMUNITY): Payer: Self-pay | Admitting: Internal Medicine

## 2021-12-06 ENCOUNTER — Encounter: Payer: Self-pay | Admitting: Internal Medicine

## 2021-12-06 ENCOUNTER — Ambulatory Visit (INDEPENDENT_AMBULATORY_CARE_PROVIDER_SITE_OTHER): Payer: 59 | Admitting: Internal Medicine

## 2021-12-06 VITALS — BP 140/90 | HR 58 | Ht 67.5 in | Wt 325.8 lb

## 2021-12-06 DIAGNOSIS — M25512 Pain in left shoulder: Secondary | ICD-10-CM | POA: Diagnosis not present

## 2021-12-06 DIAGNOSIS — I10 Essential (primary) hypertension: Secondary | ICD-10-CM | POA: Diagnosis not present

## 2021-12-06 DIAGNOSIS — Z1211 Encounter for screening for malignant neoplasm of colon: Secondary | ICD-10-CM

## 2021-12-06 DIAGNOSIS — Z113 Encounter for screening for infections with a predominantly sexual mode of transmission: Secondary | ICD-10-CM | POA: Diagnosis not present

## 2021-12-06 DIAGNOSIS — R202 Paresthesia of skin: Secondary | ICD-10-CM | POA: Diagnosis not present

## 2021-12-06 DIAGNOSIS — R7303 Prediabetes: Secondary | ICD-10-CM | POA: Diagnosis not present

## 2021-12-06 DIAGNOSIS — J302 Other seasonal allergic rhinitis: Secondary | ICD-10-CM | POA: Diagnosis not present

## 2021-12-06 DIAGNOSIS — R69 Illness, unspecified: Secondary | ICD-10-CM | POA: Diagnosis not present

## 2021-12-06 DIAGNOSIS — E782 Mixed hyperlipidemia: Secondary | ICD-10-CM | POA: Diagnosis not present

## 2021-12-06 NOTE — Progress Notes (Signed)
Chief Complaint: Colon cancer screening  HPI : 51 year female with GERD, obesity, and asthma presents to discuss colon cancer screening  Denies blood in stools, constipation, diarrhea, or weight loss. Mother had colon polyps. Denies family history of colon cancer. Has acid reflux depending on what she eats. She does not take any medications to help with acid reflux. Denies dysphagia or N&V. Denies prior colonoscopy. She will be scheduled for gynecologic surgery in 12/2021-01/2022 for removal of IUD. Denies use of blood thinners. She is scheduled to see a weight loss clinic soon.  Past Medical History:  Diagnosis Date   Asthma    Hyperlipidemia    Hypertension    Past Surgical History:  Procedure Laterality Date   TONSILLECTOMY     TUBAL LIGATION     Family History  Problem Relation Age of Onset   CAD Mother    Pancreatic cancer Father    Hypertension Sister    Breast cancer Paternal Grandmother    Cancer Paternal Grandfather    Pancreatic cancer Paternal Uncle    Social History   Tobacco Use   Smoking status: Never   Smokeless tobacco: Never  Vaping Use   Vaping Use: Never used  Substance Use Topics   Alcohol use: Yes    Comment: social   Drug use: No   Current Outpatient Medications  Medication Sig Dispense Refill   diclofenac Sodium (VOLTAREN) 1 % GEL Apply 2 g topically 4 (four) times daily.     hydrochlorothiazide (HYDRODIURIL) 25 MG tablet Take 25 mg by mouth daily.     levonorgestrel (MIRENA) 20 MCG/24HR IUD 1 each by Intrauterine route once. Every 5 years     meloxicam (MOBIC) 15 MG tablet Take 15 mg by mouth as needed for pain.     Olopatadine HCl 0.2 % SOLN 1 gtt     pantoprazole (PROTONIX) 20 MG tablet Take 1 tablet (20 mg total) by mouth daily. 30 tablet 0   No current facility-administered medications for this visit.   No Known Allergies   Review of Systems: All systems reviewed and negative except where noted in HPI.   Physical Exam: BP (!)  140/90 (BP Location: Left Arm, Patient Position: Sitting)   Pulse (!) 58   Ht 5' 7.5" (1.715 m)   Wt (!) 325 lb 12.8 oz (147.8 kg)   BMI 50.27 kg/m  Constitutional: Pleasant,well-developed, female in no acute distress. HEENT: Normocephalic and atraumatic. Conjunctivae are normal. No scleral icterus. Cardiovascular: Normal rate, regular rhythm.  Pulmonary/chest: Effort normal and breath sounds normal. No wheezing, rales or rhonchi. Abdominal: Soft, nondistended, nontender. Bowel sounds active throughout. There are no masses palpable. No hepatomegaly. Extremities: No edema Neurological: Alert and oriented to person place and time. Skin: Skin is warm and dry. No rashes noted. Psychiatric: Normal mood and affect. Behavior is normal.  Labs 08/2021: CBC, CMP, and lipase unremarkable.  Limited U/S 01/03/18: IMPRESSION: No acute abnormality noted.  Pelvic U/S 11/14/20: IMPRESSION: Probable multiple uterine leiomyomata. IUD identified at upper uterine segment endometrial canal. Nonvisualization of RIGHT ovary with 6.3 cm diameter simple appearing cyst in LEFT adnexa arising exophytic from the LEFT ovary; follow-up imaging recommended in 3-6 months as above.  ASSESSMENT AND PLAN: Colon cancer screening Patient is interested in a colonoscopy for colon cancer screening. I went over the risks and benefits of the colonoscopy procedure in detail with the patient and she is interested in proceeding. Due to her BMI, will  perform her procedure in the  hospital. - Colonoscopy [facility] on 10/19 due to BMI >50  Christia Reading, MD

## 2021-12-06 NOTE — Patient Instructions (Addendum)
_______________________________________________________  If you are age 50 or older, your body mass index should be between 23-30. Your Body mass index is 50.27 kg/m. If this is out of the aforementioned range listed, please consider follow up with your Primary Care Provider.  If you are age 35 or younger, your body mass index should be between 19-25. Your Body mass index is 50.27 kg/m. If this is out of the aformentioned range listed, please consider follow up with your Primary Care Provider.   ________________________________________________________  The [facility] providers would like to encourage you to use [facility] to communicate with providers for non-urgent requests or questions.  Due to long hold times on the telephone, sending your provider a message by [facility] may be a faster and more efficient way to get a response.  Please allow 48 business hours for a response.  Please remember that this is for non-urgent requests.  _______________________________________________________  .It was a pleasure to see you today!  Thank you for trusting me with your gastrointestinal care!

## 2021-12-07 ENCOUNTER — Ambulatory Visit (HOSPITAL_COMMUNITY)
Admission: RE | Admit: 2021-12-07 | Discharge: 2021-12-07 | Disposition: A | Discharge status: Home / Self Care | Payer: 59 | Attending: Internal Medicine | Admitting: Internal Medicine

## 2021-12-07 ENCOUNTER — Ambulatory Visit (HOSPITAL_COMMUNITY): Payer: 59 | Admitting: Certified Registered Nurse Anesthetist

## 2021-12-07 ENCOUNTER — Ambulatory Visit (HOSPITAL_BASED_OUTPATIENT_CLINIC_OR_DEPARTMENT_OTHER): Payer: 59 | Admitting: Certified Registered Nurse Anesthetist

## 2021-12-07 ENCOUNTER — Encounter (HOSPITAL_COMMUNITY)
Admission: RE | Disposition: A | Discharge status: Home / Self Care | Payer: Self-pay | Source: Home / Self Care | Attending: Internal Medicine

## 2021-12-07 ENCOUNTER — Encounter (HOSPITAL_COMMUNITY): Payer: Self-pay | Admitting: Internal Medicine

## 2021-12-07 DIAGNOSIS — D123 Benign neoplasm of transverse colon: Secondary | ICD-10-CM | POA: Diagnosis not present

## 2021-12-07 DIAGNOSIS — K635 Polyp of colon: Secondary | ICD-10-CM | POA: Diagnosis not present

## 2021-12-07 DIAGNOSIS — Z1211 Encounter for screening for malignant neoplasm of colon: Secondary | ICD-10-CM | POA: Insufficient documentation

## 2021-12-07 DIAGNOSIS — I10 Essential (primary) hypertension: Secondary | ICD-10-CM | POA: Diagnosis not present

## 2021-12-07 DIAGNOSIS — J45909 Unspecified asthma, uncomplicated: Secondary | ICD-10-CM | POA: Diagnosis not present

## 2021-12-07 DIAGNOSIS — K648 Other hemorrhoids: Secondary | ICD-10-CM | POA: Insufficient documentation

## 2021-12-07 DIAGNOSIS — Z6841 Body Mass Index (BMI) 40.0 and over, adult: Secondary | ICD-10-CM | POA: Insufficient documentation

## 2021-12-07 DIAGNOSIS — Z1212 Encounter for screening for malignant neoplasm of rectum: Secondary | ICD-10-CM | POA: Diagnosis not present

## 2021-12-07 DIAGNOSIS — K6389 Other specified diseases of intestine: Secondary | ICD-10-CM | POA: Diagnosis not present

## 2021-12-07 HISTORY — PX: COLONOSCOPY WITH PROPOFOL: SHX5780

## 2021-12-07 HISTORY — PX: POLYPECTOMY: SHX5525

## 2021-12-07 SURGERY — COLONOSCOPY WITH PROPOFOL
Anesthesia: Monitor Anesthesia Care

## 2021-12-07 MED ORDER — PROPOFOL 500 MG/50ML IV EMUL
INTRAVENOUS | Status: DC | PRN
  Administered 2021-12-07: 125 ug/kg/min via INTRAVENOUS

## 2021-12-07 MED ORDER — LIDOCAINE HCL (CARDIAC) PF 100 MG/5ML IV SOSY
PREFILLED_SYRINGE | INTRAVENOUS | Status: DC | PRN
  Administered 2021-12-07: 100 mg via INTRAVENOUS

## 2021-12-07 MED ORDER — LACTATED RINGERS IV SOLN: INTRAVENOUS | Status: DC | PRN

## 2021-12-07 MED ORDER — PROPOFOL 10 MG/ML IV BOLUS
INTRAVENOUS | Status: DC | PRN
  Administered 2021-12-07: 30 mg via INTRAVENOUS
  Administered 2021-12-07: 50 mg via INTRAVENOUS
  Administered 2021-12-07: 20 mg via INTRAVENOUS

## 2021-12-07 SURGICAL SUPPLY — 22 items

## 2021-12-07 NOTE — H&P (Signed)
GASTROENTEROLOGY PROCEDURE H&P NOTE   Primary Care Physician: Trey Sailors, PA    Reason for Procedure:   Colon cancer screening  Plan:    Colonoscopy  Patient is appropriate for endoscopic procedure(s) in the hospital setting.  The nature of the procedure, as well as the risks, benefits, and alternatives were carefully and thoroughly reviewed with the patient. Ample time for discussion and questions allowed. The patient understood, was satisfied, and agreed to proceed.     HPI: Glenda Lee is a 51 y.o. female who presents for colonoscopy for evaluation of colon cancer screening .  Patient was most recently seen in the Gastroenterology Clinic on 12/06/21.  No interval change in medical history since that appointment. Please refer to that note for full details regarding GI history and clinical presentation.   Past Medical History:  Diagnosis Date   Asthma    Hyperlipidemia    Hypertension     Past Surgical History:  Procedure Laterality Date   TONSILLECTOMY     TUBAL LIGATION      Prior to Admission medications   Medication Sig Start Date End Date Taking? Authorizing Provider  diclofenac Sodium (VOLTAREN) 1 % GEL Apply 2 g topically 4 (four) times daily as needed (pain).   Yes [provider]  hydrochlorothiazide (HYDRODIURIL) 25 MG tablet Take 25 mg by mouth daily. 10/09/19  Yes [provider]  levonorgestrel (MIRENA) 20 MCG/24HR IUD 1 each by Intrauterine route once. Every 5 years   Yes [provider]  Olopatadine HCl 0.2 % SOLN Place 1 drop into both eyes 2 (two) times daily as needed (eye allergies). 11/10/20  Yes [provider]  meloxicam (MOBIC) 15 MG tablet Take 15 mg by mouth daily as needed for pain. 12/06/17   [provider]    No current facility-administered medications for this encounter.    Allergies as of 12/06/2021   (No Known Allergies)    Family History  Problem Relation Age of Onset   CAD  Mother    Pancreatic cancer Father    Hypertension Sister    Breast cancer Paternal Grandmother    Cancer Paternal Grandfather    Pancreatic cancer Paternal Uncle     Social History   Socioeconomic History   Marital status: Single    Spouse name: Not on file   Number of children: 3   Years of education: Not on file   Highest education level: Not on file  Occupational History    Comment: [employer]  Tobacco Use   Smoking status: Never   Smokeless tobacco: Never  Vaping Use   Vaping Use: Never used  Substance and Sexual Activity   Alcohol use: Yes    Comment: social   Drug use: No   Sexual activity: Yes    Birth control/protection: Surgical  Other Topics Concern   Not on file  Social History Narrative   Not on file   Social Determinants of Health   Financial Resource Strain: Not on file  Food Insecurity: Not on file  Transportation Needs: Not on file  Physical Activity: Not on file  Stress: Not on file  Social Connections: Not on file  Intimate Partner Violence: Not on file    Physical Exam: Vital signs in last 24 hours: BP (!) 150/76   Pulse (!) 59   Temp (!) 96.1 F (35.6 C) (Tympanic)   Resp 16   Ht '5\' 7"'$  (1.702 m)   Wt (!) 147.4 kg   SpO2  99%   BMI 50.90 kg/m  GEN: NAD EYE: Sclerae anicteric ENT: MMM CV: Non-tachycardic Pulm: No increased WOB GI: Soft NEURO:  Alert & Oriented   Christia Reading, MD [facility]   12/07/2021 9:03 AM

## 2021-12-07 NOTE — Transfer of Care (Signed)
Immediate Anesthesia Transfer of Care Note  Patient: Glenda Lee  Procedure(s) Performed: COLONOSCOPY WITH PROPOFOL POLYPECTOMY  Patient Location: Endoscopy Unit  Anesthesia Type:MAC  Level of Consciousness: awake, alert , oriented and patient cooperative  Airway & Oxygen Therapy: Patient Spontanous Breathing and Patient connected to face mask oxygen  Post-op Assessment: Report given to RN and Post -op Vital signs reviewed and stable  Post vital signs: Reviewed and stable  Last Vitals:  Vitals Value Taken Time  BP 113/67 12/07/21 1044  Temp 35.6 C 12/07/21 1044  Pulse 63 12/07/21 1044  Resp 8 12/07/21 1045  SpO2 100 % 12/07/21 1044  Vitals shown include unvalidated device data.  Last Pain:  Vitals:   12/07/21 1044  TempSrc: Tympanic  PainSc: Asleep         Complications: No notable events documented.

## 2021-12-07 NOTE — Op Note (Signed)
Glenda Lee Patient Name: Glenda Lee Procedure Date: 12/07/2021 MRN: [id number] Attending MD: Glenda Lee ,  Date of Birth: March 17, 1970 CSN: [id number] Age: 51 Admit Type: Outpatient Procedure:                Colonoscopy Indications:              Screening for colorectal malignant neoplasm, This                            is the patient's first colonoscopy Providers:                Glenda Lee" Glenda Oxford, RN, Glenda Lee, Technician Referring MD:             Glenda Lee Medicines:                Monitored Anesthesia Care Complications:            No immediate complications. Estimated Blood Loss:     Estimated blood loss was minimal. Procedure:                Pre-Anesthesia Assessment:                           - Prior to the procedure, a History and Physical                            was performed, and patient medications and                            allergies were reviewed. The patient's tolerance of                            previous anesthesia was also reviewed. The risks                            and benefits of the procedure and the sedation                            options and risks were discussed with the patient.                            All questions were answered, and informed consent                            was obtained. Prior Anticoagulants: The patient has                            taken no previous anticoagulant or antiplatelet                            agents. ASA Grade Assessment: III - A patient with  severe systemic disease. After reviewing the risks                            and benefits, the patient was deemed in                            satisfactory condition to undergo the procedure.                           After obtaining informed consent, the colonoscope                            was passed under direct vision. Throughout the                             procedure, the patient's blood pressure, pulse, and                            oxygen saturations were monitored continuously. The                            [id number]-HQ190L ([id number]) Olympus colonoscope was                            introduced through the anus and advanced to the the                            terminal ileum. The colonoscopy was performed                            without difficulty. The patient tolerated the                            procedure well. The quality of the bowel                            preparation was good. The terminal ileum, ileocecal                            valve, appendiceal orifice, and rectum were                            photographed. Scope In: 10:24:45 AM Scope Out: 10:41:59 AM Scope Withdrawal Time: 0 hours 13 minutes 23 seconds  Total Procedure Duration: 0 hours 17 minutes 14 seconds  Findings:      The terminal ileum appeared normal.      A 2 mm polyp was found in the transverse colon. The polyp was sessile.       The polyp was removed with a cold biopsy forceps. Resection and       retrieval were complete.      Non-bleeding internal hemorrhoids were found during retroflexion. Impression:               - The examined portion of the ileum was normal.                           -  One 2 mm polyp in the transverse colon, removed                            with a cold biopsy forceps. Resected and retrieved.                           - Non-bleeding internal hemorrhoids. Moderate Sedation:      Not Applicable - Patient had care per Anesthesia. Recommendation:           - Discharge patient to home (with escort).                           - Await pathology results.                           - The findings and recommendations were discussed                            with the patient. Procedure Code(s):        --- Professional ---                           [id number], Colonoscopy, flexible; with biopsy, single                             or multiple Diagnosis Code(s):        --- Professional ---                           Z12.11, Encounter for screening for malignant                            neoplasm of colon                           K64.8, Other hemorrhoids                           K63.5, Polyp of colon CPT copyright 2019 [employer]. All rights reserved. The codes documented in this report are preliminary and upon coder review may  be revised to meet current compliance requirements. Dr Glenda Lee "Glenda Lee" Glenda Lee,  12/07/2021 10:48:23 AM Number of Addenda: 0

## 2021-12-07 NOTE — Discharge Instructions (Signed)
YOU HAD AN ENDOSCOPIC PROCEDURE TODAY: Refer to the procedure report and other information in the discharge instructions given to you for any specific questions about what was found during the examination. If this information does not answer your questions, please call [facility] office at [phone] to clarify.  ° °YOU SHOULD EXPECT: Some feelings of bloating in the abdomen. Passage of more gas than usual. Walking can help get rid of the air that was put into your GI tract during the procedure and reduce the bloating. If you had a lower endoscopy (such as a colonoscopy or flexible sigmoidoscopy) you may notice spotting of blood in your stool or on the toilet paper. Some abdominal soreness may be present for a day or two, also. ° °DIET: Your first meal following the procedure should be a light meal and then it is ok to progress to your normal diet. A half-sandwich or bowl of soup is an example of a good first meal. Heavy or fried foods are harder to digest and may make you feel nauseous or bloated. Drink plenty of fluids but you should avoid alcoholic beverages for 24 hours. If you had a esophageal dilation, please see attached instructions for diet.   ° °ACTIVITY: Your care partner should take you home directly after the procedure. You should plan to take it easy, moving slowly for the rest of the day. You can resume normal activity the day after the procedure however YOU SHOULD NOT DRIVE, use power tools, machinery or perform tasks that involve climbing or major physical exertion for 24 hours (because of the sedation medicines used during the test).  ° °SYMPTOMS TO REPORT IMMEDIATELY: °A gastroenterologist can be reached at any hour. Please call [phone]  for any of the following symptoms:  °Following lower endoscopy (colonoscopy, flexible sigmoidoscopy) °Excessive amounts of blood in the stool  °Significant tenderness, worsening of abdominal pains  °Swelling of the abdomen that is new, acute  °Fever of 100° or  higher  °Following upper endoscopy (EGD, EUS, ERCP, esophageal dilation) °Vomiting of blood or coffee ground material  °New, significant abdominal pain  °New, significant chest pain or pain under the shoulder blades  °Painful or persistently difficult swallowing  °New shortness of breath  °Black, tarry-looking or red, bloody stools ° °FOLLOW UP:  °If any biopsies were taken you will be contacted by phone or by letter within the next 1-3 weeks. Call [phone]  if you have not heard about the biopsies in 3 weeks.  °Please also call with any specific questions about appointments or follow up tests. ° °

## 2021-12-07 NOTE — Anesthesia Preprocedure Evaluation (Signed)
Anesthesia Evaluation  Patient identified by MRN, date of birth, ID band Patient awake    Reviewed: Allergy & Precautions, NPO status , Patient's Chart, lab work & pertinent test results  History of Anesthesia Complications Negative for: history of anesthetic complications  Airway Mallampati: III  TM Distance: >3 FB Neck ROM: Full    Dental  (+) Teeth Intact, Dental Advisory Given   Pulmonary asthma , Patient abstained from smoking.,    breath sounds clear to auscultation       Cardiovascular hypertension, Pt. on medications (-) angina(-) Past MI and (-) CHF  Rhythm:Regular     Neuro/Psych negative neurological ROS  negative psych ROS   GI/Hepatic Neg liver ROS, screening colonoscopy   Endo/Other  Morbid obesity  Renal/GU negative Renal ROS     Musculoskeletal negative musculoskeletal ROS (+)   Abdominal   Peds  Hematology negative hematology ROS (+)   Anesthesia Other Findings   Reproductive/Obstetrics                             Anesthesia Physical Anesthesia Plan  ASA: 3  Anesthesia Plan: MAC   Post-op Pain Management:    Induction: Intravenous  PONV Risk Score and Plan: 2 and Propofol infusion and Treatment may vary due to age or medical condition  Airway Management Planned: Nasal Cannula and Natural Airway  Additional Equipment: None  Intra-op Plan:   Post-operative Plan:   Informed Consent: I have reviewed the patients History and Physical, chart, labs and discussed the procedure including the risks, benefits and alternatives for the proposed anesthesia with the patient or authorized representative who has indicated his/her understanding and acceptance.     Dental advisory given  Plan Discussed with: CRNA  Anesthesia Plan Comments:         Anesthesia Quick Evaluation

## 2021-12-08 ENCOUNTER — Encounter: Payer: Self-pay | Admitting: Internal Medicine

## 2021-12-08 LAB — SURGICAL PATHOLOGY

## 2021-12-08 NOTE — Anesthesia Postprocedure Evaluation (Signed)
Anesthesia Post Note  Patient: Glenda Lee  Procedure(s) Performed: COLONOSCOPY WITH PROPOFOL POLYPECTOMY     Patient location during evaluation: Endoscopy Anesthesia Type: MAC Level of consciousness: awake and alert Pain management: pain level controlled Vital Signs Assessment: post-procedure vital signs reviewed and stable Respiratory status: spontaneous breathing, nonlabored ventilation and respiratory function stable Cardiovascular status: stable and blood pressure returned to baseline Postop Assessment: no apparent nausea or vomiting Anesthetic complications: no   No notable events documented.  Last Vitals:  Vitals:   12/07/21 1054 12/07/21 1104  BP: 124/78 (!) 157/92  Pulse: (!) 56 (!) 55  Resp: 18 17  Temp:    SpO2: 98% 100%    Last Pain:  Vitals:   12/07/21 1104  TempSrc:   PainSc: 0-No pain                 Rettie Laird

## 2021-12-11 ENCOUNTER — Encounter (HOSPITAL_COMMUNITY): Payer: Self-pay | Admitting: Internal Medicine

## 2022-01-04 DIAGNOSIS — M25512 Pain in left shoulder: Secondary | ICD-10-CM | POA: Diagnosis not present

## 2022-01-04 DIAGNOSIS — J302 Other seasonal allergic rhinitis: Secondary | ICD-10-CM | POA: Diagnosis not present

## 2022-01-04 DIAGNOSIS — E782 Mixed hyperlipidemia: Secondary | ICD-10-CM | POA: Diagnosis not present

## 2022-01-04 DIAGNOSIS — L700 Acne vulgaris: Secondary | ICD-10-CM | POA: Diagnosis not present

## 2022-01-04 DIAGNOSIS — R202 Paresthesia of skin: Secondary | ICD-10-CM | POA: Diagnosis not present

## 2022-01-04 DIAGNOSIS — R7303 Prediabetes: Secondary | ICD-10-CM | POA: Diagnosis not present

## 2022-01-04 DIAGNOSIS — R69 Illness, unspecified: Secondary | ICD-10-CM | POA: Diagnosis not present

## 2022-01-04 DIAGNOSIS — I10 Essential (primary) hypertension: Secondary | ICD-10-CM | POA: Diagnosis not present

## 2022-02-01 DIAGNOSIS — Z0100 Encounter for examination of eyes and vision without abnormal findings: Secondary | ICD-10-CM | POA: Diagnosis not present

## 2022-02-01 DIAGNOSIS — H5213 Myopia, bilateral: Secondary | ICD-10-CM | POA: Diagnosis not present

## 2022-02-22 ENCOUNTER — Other Ambulatory Visit: Payer: Self-pay

## 2022-02-22 ENCOUNTER — Emergency Department (HOSPITAL_BASED_OUTPATIENT_CLINIC_OR_DEPARTMENT_OTHER)
Admission: EM | Admit: 2022-02-22 | Discharge: 2022-02-22 | Disposition: A | Discharge status: Home / Self Care | Payer: 59 | Attending: Emergency Medicine | Admitting: Emergency Medicine

## 2022-02-22 ENCOUNTER — Encounter (HOSPITAL_BASED_OUTPATIENT_CLINIC_OR_DEPARTMENT_OTHER): Payer: Self-pay | Admitting: Emergency Medicine

## 2022-02-22 DIAGNOSIS — J45909 Unspecified asthma, uncomplicated: Secondary | ICD-10-CM | POA: Diagnosis not present

## 2022-02-22 DIAGNOSIS — J101 Influenza due to other identified influenza virus with other respiratory manifestations: Secondary | ICD-10-CM | POA: Insufficient documentation

## 2022-02-22 DIAGNOSIS — R509 Fever, unspecified: Secondary | ICD-10-CM | POA: Diagnosis present

## 2022-02-22 DIAGNOSIS — Z1152 Encounter for screening for COVID-19: Secondary | ICD-10-CM | POA: Diagnosis not present

## 2022-02-22 LAB — GROUP A STREP BY PCR: Group A Strep by PCR: NOT DETECTED

## 2022-02-22 LAB — RESP PANEL BY RT-PCR (RSV, FLU A&B, COVID)  RVPGX2
Influenza A by PCR: NEGATIVE
Influenza B by PCR: POSITIVE — AB
Resp Syncytial Virus by PCR: NEGATIVE
SARS Coronavirus 2 by RT PCR: NEGATIVE

## 2022-02-22 MED ORDER — ACETAMINOPHEN 500 MG PO TABS
1000.0000 mg | ORAL_TABLET | Freq: Once | ORAL | Status: AC
  Administered 2022-02-22: 1000 mg via ORAL
  Filled 2022-02-22: qty 2

## 2022-02-22 MED ORDER — OSELTAMIVIR PHOSPHATE 75 MG PO CAPS: 75.0000 mg | ORAL_CAPSULE | Freq: Two times a day (BID) | ORAL | 0 refills | Status: DC

## 2022-02-22 NOTE — ED Triage Notes (Signed)
Suddenly last night body aches/ sore throat/ congest ,subjective fevers. Hurts to cough. Hard to get up any sputum. Pt feels like she is wheezing,, pt does have asthma.

## 2022-02-22 NOTE — Discharge Instructions (Signed)
You have the flu this is a viral infection that will likely start to improve after 7-10 days, antibiotics are not helpful in treating viral infections.  You may take Tamiflu twice a day for the next 5 days this will help shorten the duration of the flu and prevent complications, this medication can cause some upset stomach, nausea and diarrhea. Please make sure you are drinking plenty of fluids. You can treat your symptoms supportively with tylenol 650 mg/'1000mg'$  and ibuprofen 600 mg every 6 hours for fevers and pains. For nasal congestion you can use Zyrtec and Flonase to help with nasal congestion. To treat cough you can use over the counter cough medications such as Mucinex DM or Robitussin and throat lozenges. If your symptoms are not improving please follow up with you Primary doctor.   If you develop persistent fevers, shortness of breath or difficulty breathing, chest pain, severe headache and neck pain, persistent nausea and vomiting or other new or concerning symptoms return to the Emergency department.

## 2022-02-22 NOTE — ED Provider Notes (Addendum)
[facility] Provider Note   CSN: [id number] Arrival date & time: 02/22/22  0848     History  Chief Complaint  Patient presents with   URI    Glenda Lee is a 52 y.o. female.  Autymn Omlor is a 52 y.o. female with hx of HTN and HLD, who presents with bodyaches, sore throat, congestion, and dry cough. Pt reports subjective fevers. Symptoms started last night. Unsure of sick contacts. Pt noted some wheezing last night, but no SOB, underlying hx of asthma, symptoms resolved with inhaler. No CP aside from some soreness with cough. No nausea, vomiting or diarrhea.  The history is provided by the patient and medical records.  URI Presenting symptoms: congestion, cough, fever, rhinorrhea and sore throat   Associated symptoms: headaches and myalgias   Associated symptoms: no neck pain        Home Medications Prior to Admission medications   Medication Sig Start Date End Date Taking? Authorizing Provider  diclofenac Sodium (VOLTAREN) 1 % GEL Apply 2 g topically 4 (four) times daily as needed (pain).    [provider]  hydrochlorothiazide (HYDRODIURIL) 25 MG tablet Take 25 mg by mouth daily. 10/09/19   [provider]  levonorgestrel (MIRENA) 20 MCG/24HR IUD 1 each by Intrauterine route once. Every 5 years    [provider]  meloxicam (MOBIC) 15 MG tablet Take 15 mg by mouth daily as needed for pain. 12/06/17   [provider]  Olopatadine HCl 0.2 % SOLN Place 1 drop into both eyes 2 (two) times daily as needed (eye allergies). 11/10/20   [provider]      Allergies    Patient has no known allergies.    Review of Systems   Review of Systems  Constitutional:  Positive for chills and fever.  HENT:  Positive for congestion, rhinorrhea and sore throat.   Respiratory:  Positive for cough. Negative for shortness of breath.   Cardiovascular:  Negative for chest pain.  Gastrointestinal:  Negative for  abdominal pain, nausea and vomiting.  Musculoskeletal:  Positive for myalgias. Negative for neck pain and neck stiffness.  Neurological:  Positive for headaches.    Physical Exam Updated Vital Signs BP (!) 171/113 (BP Location: Right Wrist)   Pulse 67   Temp 99.7 F (37.6 C) (Oral)   Resp (!) 22   SpO2 100%  Physical Exam Vitals and nursing note reviewed.  Constitutional:      General: She is not in acute distress.    Appearance: Normal appearance. She is well-developed. She is not ill-appearing or diaphoretic.  HENT:     Head: Normocephalic and atraumatic.     Nose: Rhinorrhea present.     Mouth/Throat:     Mouth: Mucous membranes are moist.     Pharynx: Oropharynx is clear.  Eyes:     General:        Right eye: No discharge.        Left eye: No discharge.  Neck:     Comments: No rigidity Cardiovascular:     Rate and Rhythm: Normal rate and regular rhythm.     Heart sounds: Normal heart sounds. No murmur heard.    No friction rub. No gallop.  Pulmonary:     Effort: Pulmonary effort is normal. No respiratory distress.     Breath sounds: Normal breath sounds.     Comments: Respirations equal and unlabored, patient able to speak in full sentences, lungs clear to auscultation bilaterally  Abdominal:     General: Bowel sounds are normal. There is no distension.     Palpations: Abdomen is soft. There is no mass.     Tenderness: There is no abdominal tenderness. There is no guarding.     Comments: Abdomen soft, nondistended, nontender to palpation in all quadrants without guarding or peritoneal signs  Musculoskeletal:        General: No deformity.     Cervical back: Neck supple.  Lymphadenopathy:     Cervical: No cervical adenopathy.  Skin:    General: Skin is warm and dry.     Capillary Refill: Capillary refill takes less than 2 seconds.  Neurological:     Mental Status: She is alert and oriented to person, place, and time.  Psychiatric:        Mood and Affect: Mood  normal.        Behavior: Behavior normal.     ED Results / Procedures / Treatments   Labs (all labs ordered are listed, but only abnormal results are displayed) Labs Reviewed  RESP PANEL BY RT-PCR (RSV, FLU A&B, COVID)  RVPGX2 - Abnormal; Notable for the following components:      Result Value   Influenza B by PCR POSITIVE (*)    All other components within normal limits  GROUP A STREP BY PCR    EKG None  Radiology No results found.  Procedures Procedures    Medications Ordered in ED Medications  acetaminophen (TYLENOL) tablet 1,000 mg (1,000 mg Oral Given 02/22/22 0957)    ED Course/ Medical Decision Making/ A&P                           Medical Decision Making Risk OTC drugs. Prescription drug management.   Patient with symptoms consistent with influenza.  Febrile on arrival, improved with medication.  No signs of dehydration, tolerating PO's.  Lungs are clear, pt without hypoxia or tachypnea, suspicion for pneumonia is low, do not feel CXR is indicated at this time. Pt has tested positive for influenza B. Pt within 48 hour window, discussed cost vs. benefit of tamiflu, pt would like to take tamiflu.  Patient will be discharged with instructions to orally hydrate, rest, and use over-the-counter medications such as anti-inflammatories ibuprofen and Aleve for muscle aches and Tylenol for fever.   At this time there does not appear to be any evidence of an acute emergency medical condition requiring further emergent evaluation and the patient appears stable for discharge with appropriate outpatient follow up. Diagnosis and return precautions discussed with patient who verbalizes understanding and is agreeable to discharge.            Final Clinical Impression(s) / ED Diagnoses Final diagnoses:  Influenza B    Rx / DC Orders ED Discharge Orders          Ordered    oseltamivir (TAMIFLU) 75 MG capsule  Every 12 hours        02/22/22 1549               Jacqlyn Larsen, PA-C 03/11/22 1519    Jacqlyn Larsen, PA-C 03/11/22 Eden, Ankit, MD 03/12/22 1435

## 2022-03-25 NOTE — H&P (Signed)
Glenda Lee is an 52 y.o. G2P2 who is admitted for Laparoscopic unilateral oophorectomy, removal of pelvic mass, bilateral salpingectomy due to left adnexal mass and desire for permanent sterilization, and Hysteroscopic removal of IUD for lost IUD strings.  Patient initially presented 10/2021 to discuss IUD removal in setting of known lost IUD strings. In reviewing her TVUS from 10/2020, patient reports having been unaware of the left adnexal mass seen on ultrasound. Repeat US performed as documented below which revealed 1cm growth in the mass. She reports occasional, sharp abdominal pains. ROMA score showed low risk for malignancy. Patient desires definitive management with removal of this mass, possible unilateral oophorectomy and desires bilateral salpingectomy at the time of procedure (does not desire future fertility). IUD strings are not visualized and unable to tease them from the cervical os.  Work-up: ROMA (11/28/2021): CA-125 7.5  HE4 38.1  Low risk for malignancy  TVUS  (11/21/2021):       Uterus: 11.03 x 6.66 x 7.37 cm         Endometrial thickness: 0.57 cm         Fibroid 1 3.27 cm  Fibroid 2 2.78 cm  Fibroid 3 2.87 cm  Fibroid 4 1.98 cm  Fibroid 5 2.26 cm  Fibroid 6 1.78 cm  Fibroid 7 2.56 cm         Left Ovary 1.87 cm         Anteverted uterus. Uterine fibroids- anterior right 3.3 x 3.3 x 3.2 cm, anterior 3.0 x 2.8 x 2.4 cm, posterior 2.9 x 2.9 x 2.8 cm, fundal 2.0 x 1.9 x 1.8 cm, anterior 2.3 x 2.1 x 1.1 cm, fundal 1.9 x 1.8 x 1.7 cm, right 2.8 x 2.6 x 1.8 cm. Endometrium slightly fluid filled. Endometrium slightly irregular in appearance due to fibroids. IUD seen in place. Right ovary not visualized. Left Ovary WNL. Left adnexa- complex cyst with some low-level internal echoes seen adjacent to left ovary 7.3 x 7.2 x 4.8 cm, avascular; ? Paraovarian vs paratubal. Scanned both transvaginally or transabdominally.   TVUS (11/14/2020): Narrative & Impression  CLINICAL DATA:  Lost  IUD strings. History tubal ligation. Patient states premenopausal.   EXAM: TRANSABDOMINAL ULTRASOUND OF PELVIS   TECHNIQUE: Transabdominal ultrasound examination of the pelvis was performed including evaluation of the uterus, ovaries, adnexal regions, and pelvic cul-de-sac. Transvaginal imaging was not performed   COMPARISON:  06/24/2009   FINDINGS: Uterus   Measurements: 9.7 x 4.4 x 7.4 cm = volume: 167 mL. Anteverted. Multiple nodular regions likely representing leiomyomata. These are poorly defined however due to body habitus. Measured leiomyomata appear to be 2.5 cm diameter at posterior uterus, 2.6 cm diameter at fundus, and 2.4 cm at anterior uterus.   Endometrium   Thickness: 4 mm. IUD identified at upper uterine segment endometrial canal. No endometrial fluid.   Right ovary   Not visualized, likely obscured by bowel   Left ovary   Measurements: 3.5 x 1.4 x 1.5 cm = volume: 3.9 mL. Adjacent large simple cyst 6.3 x 5.2 x 5.8 cm; Recommend follow-up US in 3-6 months. Note: This recommendation does not apply to premenarchal patients or to those with increased risk (genetic, family history, elevated tumor markers or other high-risk factors) of ovarian cancer. Reference: Radiology 2019 Nov; 293(2):359-371.   Other findings: No free pelvic fluid. No additional adnexal masses.   IMPRESSION: Probable multiple uterine leiomyomata.   IUD identified at upper uterine segment endometrial canal.   Nonvisualization of RIGHT ovary with  6.3 cm diameter simple appearing cyst in LEFT adnexa arising exophytic from the LEFT ovary; follow-up imaging recommended in 3-6 months as above.     Electronically Signed   By: Lavonia Dana M.D.   On: 11/14/2020 17:02    Patient Active Problem List   Diagnosis Date Noted   Adnexal mass 04/07/2022   IUD strings lost 04/07/2022   Thyroid nodule 10/31/2021    MEDICAL/FAMILY/SOCIAL HX: Patient's last menstrual period was  04/19/2021.    Past Medical History:  Diagnosis Date   Anemia    Hx, no current problems as of 04/05/22 per patient   Asthma    no current problems, no inhaler per patient 04/05/22   GERD (gastroesophageal reflux disease)    occasional   Headache    otc med prn   Hx of seasonal allergies    Hyperlipidemia    diet, no meds   Hypertension    tx w/ hctz    Past Surgical History:  Procedure Laterality Date   COLONOSCOPY WITH PROPOFOL N/A 12/07/2021   Procedure: COLONOSCOPY WITH PROPOFOL;  Surgeon: Sharyn Creamer, MD;  Location: [facility];  Service: Gastroenterology;  Laterality: N/A;   POLYPECTOMY  12/07/2021   Procedure: POLYPECTOMY;  Surgeon: Sharyn Creamer, MD;  Location: [facility];  Service: Gastroenterology;;   TONSILLECTOMY     TUBAL LIGATION     WISDOM TOOTH EXTRACTION      Family History  Problem Relation Age of Onset   CAD Mother    Pancreatic cancer Father    Hypertension Sister    Breast cancer Paternal Grandmother    Cancer Paternal Grandfather    Pancreatic cancer Paternal Uncle     Social History:  reports that she has been smoking cigars. She has never used smokeless tobacco. She reports current alcohol use. She reports that she does not use drugs.  ALLERGIES/MEDS:  Allergies: No Known Allergies  No medications prior to admission.     Review of Systems  Constitutional: Negative.   HENT: Negative.    Eyes: Negative.   Respiratory: Negative.    Cardiovascular: Negative.   Gastrointestinal: Negative.   Genitourinary: Negative.   Musculoskeletal: Negative.   Skin: Negative.   Neurological: Negative.   Endo/Heme/Allergies: Negative.   Psychiatric/Behavioral: Negative.      Last menstrual period 04/19/2021. Gen:  NAD, pleasant and cooperative Cardio:  RRR Pulm:  CTAB, no wheezes/rales/rhonchi Abd:  Soft, non-distended, non-tender throughout, no rebound/guarding Ext:  No bilateral LE edema, no bilateral calf tenderness Pelvic: labia -  unremarkable, vagina - pink moist mucosa, no lesions or abnormal discharge, cervix - no discharge or lesions or CMT, IUD strings not visualized.  No results found for this or any previous visit (from the past 24 hour(s)).  No results found.   ASSESSMENT/PLAN: Glenda Lee is a 52 y.o. G2P2 who is admitted for Laparoscopic unilateral oophorectomy, removal of pelvic mass, bilateral salpingectomy due to left adnexal mass and desire for permanent sterilization, and Hysteroscopic removal of IUD for lost IUD strings.  - Admit to [facility] labs (CBC, T&S, COVID screen) - Diet:  Per anesthesia/ERAS pathway - IVF:  Per anesthesia - VTE Prophylaxis:  SCDs - Antibiotics: Ancef 2g on call to OR - D/C home same-day  Consents: I have counseled the patient that this surgery is performed to remove a pelvic mass and bilateral fallopian tubes through several small incisions in the abdomen. Prior to surgery, the risks and benefits of the surgery, as  well as alternative treatments, have been discussed. The risks include, but are not limited to, bleeding, including the need for a blood transfusion, infection, damage to surrounding organs and tissues, damage to bladder, damage to ureters, causing kidney damage, and requiring additional procedures, damage to bowels, resulting in further surgery and/or a colostomy, postoperative pain, short-term and long-term, scarring on and inside the abdominal wall, need for an open procedure, development of an incisional hernia which could require surgery in the future, need for further surgery, deep vein thrombosis and/or pulmonary embolism, wound separation or infection, painful intercourse, urinary leakage, ovarian failure, resulting in menopausal symptoms requiring treatment, complications the course of which cannot be predicted or prevented, and death.  It was also discussed that there is a chance that the mass being removed is found to be cancer during or after your  surgery.  There is a chance that cancer cells could be spread to other parts of your body during the surgery.  There is a chance that additional procedures will be performed to diagnose and treat a cancer fully.  There is a chance that cancer discovered after your surgery is over may need treatments in the future, including additional surgery.  I discussed with the patient that this surgery is performed to look inside the uterus and remove the IUD.  Prior to surgery, the risks and benefits of the surgery, as well as alternative treatments, have been discussed.  The risks include, but are not limited to bleeding, including the need for a blood transfusion, infection, damage to organs and tissues, including uterine perforation, requiring additional surgery, postoperative pain, short-term and long-term, failure of the procedure to control symptoms, need for hysterectomy to control bleeding, fluid overload, which could create electrolyte abnormalities and the need to stop the procedure before completion, inability to safely complete the procedure, deep vein thrombosis and/or pulmonary embolism, painful intercourse, complications the course of which cannot be predicted or prevented, and death.  Patient was consented for blood products.  The patient is aware that bleeding may result in the need for a blood transfusion which includes risk of transmission of HIV (1:2 million), Hepatitis C (1:2 million), and Hepatitis B (1:200 thousand) and transfusion reaction.  Patient voiced understanding of the above risks as well as understanding of indications for blood transfusion.   [name], DO

## 2022-03-27 DIAGNOSIS — R69 Illness, unspecified: Secondary | ICD-10-CM | POA: Diagnosis not present

## 2022-03-27 DIAGNOSIS — N9489 Other specified conditions associated with female genital organs and menstrual cycle: Secondary | ICD-10-CM | POA: Diagnosis not present

## 2022-03-27 DIAGNOSIS — Z01818 Encounter for other preprocedural examination: Secondary | ICD-10-CM | POA: Diagnosis not present

## 2022-04-04 ENCOUNTER — Encounter (HOSPITAL_COMMUNITY): Payer: Self-pay

## 2022-04-04 NOTE — Progress Notes (Signed)
Surgical Instructions    Your procedure is scheduled on Wednesday, 04/11/22.  Report to [facility] Main Entrance "A" at 6:30 A.M., then check in with the Admitting office.  Call this number if you have problems the morning of surgery:  [phone]   If you have any questions prior to your surgery date call [phone]: Open Monday-Friday 8am-4pm If you experience any cold or flu symptoms such as cough, fever, chills, shortness of breath, etc. between now and your scheduled surgery, please notify us at the above number     Remember:  Do not eat after midnight the night before your surgery-Tuesday  You may drink clear liquids until 5:30 AM the morning of your surgery-Wednesday.   Clear liquids allowed are: Water, Non-Citrus Juices (without pulp), Carbonated Beverages, Clear Tea, Black Coffee ONLY (NO MILK, CREAM OR POWDERED CREAMER of any kind), and Gatorade    Take these medicines the morning of surgery: Olopatadine HCl 0.2 % EYE DROPS IF NEEDED   As of today, STOP taking any Aspirin (unless otherwise instructed by your surgeon) Aleve, Naproxen, Ibuprofen, Motrin, Advil, Goody's, BC's, all herbal medications, fish oil, and all vitamins.           Do not wear jewelry or makeup. Do not wear lotions, powders, perfumes or deodorant. Do not shave 48 hours prior to surgery.   Do not bring valuables to the hospital. Do not wear nail polish, gel polish, artificial nails, or any other type of covering on natural nails (fingers and toes) If you have artificial nails or gel coating that need to be removed by a nail salon, please have this removed prior to surgery. Artificial nails or gel coating may interfere with anesthesia's ability to adequately monitor your vital signs.  [facility] is not responsible for any belongings or valuables.    Do NOT Smoke (Tobacco/Vaping)  24 hours prior to your procedure  If you use a CPAP at night, you may bring your mask for your overnight stay.    Contacts, glasses, hearing aids, dentures or partials may not be worn into surgery, please bring cases for these belongings    Patients discharged the day of surgery will not be allowed to drive home, and someone needs to stay with them for 24 hours.   SURGICAL WAITING ROOM VISITATION Patients having surgery or a procedure may have no more than 2 support people in the waiting area - these visitors may rotate.   Children under the age of 47 must have an adult with them who is not the patient. If the patient needs to stay at the hospital during part of their recovery, the visitor guidelines for inpatient rooms apply. Pre-op nurse will coordinate an appropriate time for 1 support person to accompany patient in pre-op.  This support person may not rotate.   Please refer to [email or web address] for the visitor guidelines for Inpatients (after your surgery is over and you are in a regular room).    Special instructions:    Oral Hygiene is also important to reduce your risk of infection.  Remember - BRUSH YOUR TEETH THE MORNING OF SURGERY WITH YOUR REGULAR TOOTHPASTE   [place]- Preparing For Surgery  Before surgery, you can play an important role. Because skin is not sterile, your skin needs to be as free of germs as possible. You can reduce the number of germs on your skin by washing with CHG (chlorahexidine gluconate) Soap before surgery.  CHG is an antiseptic cleaner which kills germs and  bonds with the skin to continue killing germs even after washing.     Please do not use if you have an allergy to CHG or antibacterial soaps. If your skin becomes reddened/irritated stop using the CHG.  Do not shave (including legs and underarms) for at least 48 hours prior to first CHG shower. It is OK to shave your face.  Please follow these instructions carefully.     Shower the NIGHT BEFORE SURGERY -  Tuesday and the MORNING OF SURGERY - Wednesday  with CHG Soap.   If you chose to wash your hair, wash your hair first as usual with your normal shampoo. After you shampoo, rinse your hair and body thoroughly to remove the shampoo.  Then [employer] and genitals (private parts) with your normal soap and rinse thoroughly to remove soap.  After that Use CHG Soap as you would any other liquid soap. You can apply CHG directly to the skin and wash gently with a scrungie or a clean washcloth.   Apply the CHG Soap to your body ONLY FROM THE NECK DOWN.  Do not use on open wounds or open sores. Avoid contact with your eyes, ears, mouth and genitals (private parts). Wash Face and genitals (private parts)  with your normal soap.   Wash thoroughly, paying special attention to the area where your surgery will be performed.  Thoroughly rinse your body with warm water from the neck down.  DO NOT shower/wash with your normal soap after using and rinsing off the CHG Soap.  Pat yourself dry with a CLEAN TOWEL.  Wear CLEAN PAJAMAS to bed the night before surgery  Place CLEAN SHEETS on your bed the night before your surgery  DO NOT SLEEP WITH PETS.   Day of Surgery:  Take a shower with CHG soap. Wear Clean/Comfortable clothing the morning of surgery Do not apply any deodorants/lotions.   Remember to brush your teeth WITH YOUR REGULAR TOOTHPASTE.    If you received a COVID test during your pre-op visit, it is requested that you wear a mask when out in public, stay away from anyone that may not be feeling well, and notify your surgeon if you develop symptoms. If you have been in contact with anyone that has tested positive in the last 10 days, please notify your surgeon.    Please read over the following fact sheets that you were given.

## 2022-04-04 NOTE — Progress Notes (Incomplete)
PCP - Dr Doristine Section- Bonsu Cardiologist - n/a  Chest x-ray - n/a EKG - 04/05/22 Stress Test - n/a ECHO - n/a Cardiac Cath - n/a  ICD Pacemaker/Loop - n/a  Sleep Study -   CPAP - ***  Diabetes - n/a  ERAS: Clear liquids til 5:30 AM  Anesthesia review: Yes  STOP now taking any Aspirin (unless otherwise instructed by your surgeon), Aleve, Naproxen, Ibuprofen, Motrin, Advil, Goody's, BC's, all herbal medications, fish oil, and all vitamins.   Coronavirus Screening Do you have any of the following symptoms:  Cough yes/no: No Fever (>100.76F)  yes/no: No Runny nose yes/no: No Sore throat yes/no: No Difficulty breathing/shortness of breath  yes/no: No  Have you traveled in the last 14 days and where? yes/no: No  Patient verbalized understanding of instructions that were given to them at the PAT appointment. Patient was also instructed that they will need to review over the PAT instructions again at home before surgery.

## 2022-04-05 ENCOUNTER — Encounter (HOSPITAL_COMMUNITY): Payer: Self-pay

## 2022-04-05 ENCOUNTER — Other Ambulatory Visit: Payer: Self-pay

## 2022-04-05 ENCOUNTER — Encounter (HOSPITAL_COMMUNITY)
Admission: RE | Admit: 2022-04-05 | Discharge: 2022-04-05 | Disposition: A | Discharge status: Home / Self Care | Payer: 59 | Source: Ambulatory Visit | Attending: Obstetrics and Gynecology | Admitting: Obstetrics and Gynecology

## 2022-04-05 DIAGNOSIS — Z30433 Encounter for removal and reinsertion of intrauterine contraceptive device: Secondary | ICD-10-CM | POA: Diagnosis not present

## 2022-04-05 DIAGNOSIS — Z01812 Encounter for preprocedural laboratory examination: Secondary | ICD-10-CM | POA: Diagnosis not present

## 2022-04-05 DIAGNOSIS — N9489 Other specified conditions associated with female genital organs and menstrual cycle: Secondary | ICD-10-CM | POA: Diagnosis not present

## 2022-04-05 DIAGNOSIS — Z01818 Encounter for other preprocedural examination: Secondary | ICD-10-CM

## 2022-04-05 HISTORY — DX: Headache, unspecified: R51.9

## 2022-04-05 HISTORY — DX: Gastro-esophageal reflux disease without esophagitis: K21.9

## 2022-04-05 HISTORY — DX: Anemia, unspecified: D64.9

## 2022-04-05 HISTORY — DX: Allergy status to unspecified drugs, medicaments and biological substances: Z88.9

## 2022-04-05 LAB — CBC
HCT: 45.1 % (ref 36.0–46.0)
Hemoglobin: 14.7 g/dL (ref 12.0–15.0)
MCH: 29.6 pg (ref 26.0–34.0)
MCHC: 32.6 g/dL (ref 30.0–36.0)
MCV: 90.7 fL (ref 80.0–100.0)
Platelets: 194 10*3/uL (ref 150–400)
RBC: 4.97 MIL/uL (ref 3.87–5.11)
RDW: 14 % (ref 11.5–15.5)
WBC: 7.5 10*3/uL (ref 4.0–10.5)
nRBC: 0 % (ref 0.0–0.2)

## 2022-04-05 LAB — BASIC METABOLIC PANEL
Anion gap: 6 (ref 5–15)
BUN: 11 mg/dL (ref 6–20)
CO2: 24 mmol/L (ref 22–32)
Calcium: 8.6 mg/dL — ABNORMAL LOW (ref 8.9–10.3)
Chloride: 107 mmol/L (ref 98–111)
Creatinine, Ser: 0.81 mg/dL (ref 0.44–1.00)
GFR, Estimated: >60 mL/min (ref >60)
Glucose, Bld: 108 mg/dL — ABNORMAL HIGH (ref 70–99)
Potassium: 3.7 mmol/L (ref 3.5–5.1)
Sodium: 137 mmol/L (ref 135–145)

## 2022-04-05 LAB — TYPE AND SCREEN
ABO/RH(D): O POS
Antibody Screen: NEGATIVE

## 2022-04-07 DIAGNOSIS — N9489 Other specified conditions associated with female genital organs and menstrual cycle: Secondary | ICD-10-CM | POA: Insufficient documentation

## 2022-04-07 DIAGNOSIS — T8332XA Displacement of intrauterine contraceptive device, initial encounter: Secondary | ICD-10-CM | POA: Insufficient documentation

## 2022-04-10 NOTE — Anesthesia Preprocedure Evaluation (Signed)
Anesthesia Evaluation  Patient identified by MRN, date of birth, ID band Patient awake    Reviewed: Allergy & Precautions, NPO status , Patient's Chart, lab work & pertinent test results  Airway Mallampati: III  TM Distance: >3 FB Neck ROM: Full    Dental no notable dental hx. (+) Teeth Intact, Dental Advisory Given   Pulmonary asthma , Current Smoker and Patient abstained from smoking.   Pulmonary exam normal breath sounds clear to auscultation       Cardiovascular hypertension, Normal cardiovascular exam Rhythm:Regular Rate:Normal     Neuro/Psych  Headaches    GI/Hepatic ,GERD  ,,  Endo/Other    Morbid obesity (BMI 50)  Renal/GU Lab Results      Component                Value               Date                      CREATININE               0.81                04/05/2022                BUN                      11                  04/05/2022                NA                       137                 04/05/2022                K                        3.7                 04/05/2022                    Musculoskeletal   Abdominal  (+) + obese  Peds  Hematology Lab Results      Component                Value               Date                      WBC                      7.5                 04/05/2022                HGB                      14.7                04/05/2022                HCT  45.1                04/05/2022                MCV                      90.7                04/05/2022                PLT                      194                 04/05/2022              Anesthesia Other Findings   Reproductive/Obstetrics                             Anesthesia Physical Anesthesia Plan  ASA: 3  Anesthesia Plan: General   Post-op Pain Management: Toradol IV (intra-op)*, Precedex and Tylenol PO (pre-op)*   Induction: Intravenous  PONV Risk Score and Plan: 3 and  Treatment may vary due to age or medical condition, Midazolam, Ondansetron, Dexamethasone and Scopolamine patch - Pre-op  Airway Management Planned: Oral ETT  Additional Equipment: None  Intra-op Plan:   Post-operative Plan: Extubation in OR  Informed Consent: I have reviewed the patients History and Physical, chart, labs and discussed the procedure including the risks, benefits and alternatives for the proposed anesthesia with the patient or authorized representative who has indicated his/her understanding and acceptance.     Dental advisory given  Plan Discussed with: CRNA, Surgeon and Anesthesiologist  Anesthesia Plan Comments:        Anesthesia Quick Evaluation

## 2022-04-11 ENCOUNTER — Encounter (HOSPITAL_COMMUNITY): Payer: Self-pay | Admitting: Obstetrics and Gynecology

## 2022-04-11 ENCOUNTER — Other Ambulatory Visit: Payer: Self-pay

## 2022-04-11 ENCOUNTER — Ambulatory Visit (HOSPITAL_COMMUNITY): Payer: 59 | Admitting: Certified Registered Nurse Anesthetist

## 2022-04-11 ENCOUNTER — Ambulatory Visit (HOSPITAL_BASED_OUTPATIENT_CLINIC_OR_DEPARTMENT_OTHER): Payer: 59 | Admitting: Certified Registered Nurse Anesthetist

## 2022-04-11 ENCOUNTER — Encounter (HOSPITAL_COMMUNITY)
Admission: RE | Disposition: A | Discharge status: Home / Self Care | Payer: Self-pay | Source: Ambulatory Visit | Attending: Obstetrics and Gynecology

## 2022-04-11 ENCOUNTER — Ambulatory Visit (HOSPITAL_COMMUNITY)
Admission: RE | Admit: 2022-04-11 | Discharge: 2022-04-11 | Disposition: A | Discharge status: Home / Self Care | Payer: 59 | Source: Ambulatory Visit | Attending: Obstetrics and Gynecology | Admitting: Obstetrics and Gynecology

## 2022-04-11 DIAGNOSIS — Z01818 Encounter for other preprocedural examination: Secondary | ICD-10-CM

## 2022-04-11 DIAGNOSIS — F17290 Nicotine dependence, other tobacco product, uncomplicated: Secondary | ICD-10-CM | POA: Diagnosis not present

## 2022-04-11 DIAGNOSIS — Z30433 Encounter for removal and reinsertion of intrauterine contraceptive device: Secondary | ICD-10-CM

## 2022-04-11 DIAGNOSIS — N838 Other noninflammatory disorders of ovary, fallopian tube and broad ligament: Secondary | ICD-10-CM | POA: Diagnosis not present

## 2022-04-11 DIAGNOSIS — D282 Benign neoplasm of uterine tubes and ligaments: Secondary | ICD-10-CM | POA: Diagnosis not present

## 2022-04-11 DIAGNOSIS — E785 Hyperlipidemia, unspecified: Secondary | ICD-10-CM | POA: Insufficient documentation

## 2022-04-11 DIAGNOSIS — Z302 Encounter for sterilization: Secondary | ICD-10-CM | POA: Insufficient documentation

## 2022-04-11 DIAGNOSIS — D271 Benign neoplasm of left ovary: Secondary | ICD-10-CM | POA: Diagnosis not present

## 2022-04-11 DIAGNOSIS — Z79899 Other long term (current) drug therapy: Secondary | ICD-10-CM | POA: Insufficient documentation

## 2022-04-11 DIAGNOSIS — I10 Essential (primary) hypertension: Secondary | ICD-10-CM

## 2022-04-11 DIAGNOSIS — K219 Gastro-esophageal reflux disease without esophagitis: Secondary | ICD-10-CM | POA: Diagnosis not present

## 2022-04-11 DIAGNOSIS — K838 Other specified diseases of biliary tract: Secondary | ICD-10-CM | POA: Diagnosis not present

## 2022-04-11 DIAGNOSIS — T8332XD Displacement of intrauterine contraceptive device, subsequent encounter: Secondary | ICD-10-CM | POA: Diagnosis not present

## 2022-04-11 DIAGNOSIS — N9489 Other specified conditions associated with female genital organs and menstrual cycle: Secondary | ICD-10-CM

## 2022-04-11 DIAGNOSIS — T8332XA Displacement of intrauterine contraceptive device, initial encounter: Secondary | ICD-10-CM

## 2022-04-11 DIAGNOSIS — F17200 Nicotine dependence, unspecified, uncomplicated: Secondary | ICD-10-CM | POA: Diagnosis not present

## 2022-04-11 DIAGNOSIS — F17210 Nicotine dependence, cigarettes, uncomplicated: Secondary | ICD-10-CM

## 2022-04-11 HISTORY — PX: LAPAROSCOPIC BILATERAL SALPINGECTOMY: SHX5889

## 2022-04-11 HISTORY — PX: HYSTEROSCOPY: SHX211

## 2022-04-11 HISTORY — PX: OOPHORECTOMY: SHX6387

## 2022-04-11 HISTORY — PX: CYSTOSCOPY: SHX5120

## 2022-04-11 LAB — POCT PREGNANCY, URINE: Preg Test, Ur: NEGATIVE

## 2022-04-11 LAB — ABO/RH: ABO/RH(D): O POS

## 2022-04-11 SURGERY — SALPINGECTOMY, BILATERAL, LAPAROSCOPIC
Anesthesia: General | Site: Cervix

## 2022-04-11 MED ORDER — OXYCODONE HCL 5 MG PO TABS: 5.0000 mg | ORAL_TABLET | Freq: Once | ORAL | Status: DC | PRN

## 2022-04-11 MED ORDER — SODIUM CHLORIDE 0.9 % IR SOLN
Status: DC | PRN
  Administered 2022-04-11: 1000 mL
  Administered 2022-04-11: 3000 mL
  Administered 2022-04-11: 1000 mL via INTRAVESICAL

## 2022-04-11 MED ORDER — FENTANYL CITRATE (PF) 250 MCG/5ML IJ SOLN
INTRAMUSCULAR | Status: AC
  Filled 2022-04-11: qty 5

## 2022-04-11 MED ORDER — LACTATED RINGERS IV SOLN: INTRAVENOUS | Status: DC

## 2022-04-11 MED ORDER — OXYCODONE HCL 5 MG/5ML PO SOLN: 5.0000 mg | Freq: Once | ORAL | Status: DC | PRN

## 2022-04-11 MED ORDER — FENTANYL CITRATE (PF) 250 MCG/5ML IJ SOLN
INTRAMUSCULAR | Status: DC | PRN
  Administered 2022-04-11: 100 ug via INTRAVENOUS
  Administered 2022-04-11: 50 ug via INTRAVENOUS

## 2022-04-11 MED ORDER — ONDANSETRON HCL 4 MG/2ML IJ SOLN: 4.0000 mg | Freq: Once | INTRAMUSCULAR | Status: DC | PRN

## 2022-04-11 MED ORDER — MIDAZOLAM HCL 2 MG/2ML IJ SOLN
INTRAMUSCULAR | Status: DC | PRN
  Administered 2022-04-11: 2 mg via INTRAVENOUS

## 2022-04-11 MED ORDER — PHENYLEPHRINE 80 MCG/ML (10ML) SYRINGE FOR IV PUSH (FOR BLOOD PRESSURE SUPPORT)
PREFILLED_SYRINGE | INTRAVENOUS | Status: DC | PRN
  Administered 2022-04-11 (×2): 160 ug via INTRAVENOUS

## 2022-04-11 MED ORDER — CEFAZOLIN IN SODIUM CHLORIDE 3-0.9 GM/100ML-% IV SOLN
3.0000 g | INTRAVENOUS | Status: AC
  Administered 2022-04-11: 3 g via INTRAVENOUS
  Filled 2022-04-11: qty 100

## 2022-04-11 MED ORDER — KETOROLAC TROMETHAMINE 30 MG/ML IJ SOLN
INTRAMUSCULAR | Status: DC | PRN
  Administered 2022-04-11: 30 mg via INTRAVENOUS

## 2022-04-11 MED ORDER — PROPOFOL 10 MG/ML IV BOLUS
INTRAVENOUS | Status: AC
  Filled 2022-04-11: qty 20

## 2022-04-11 MED ORDER — ROCURONIUM BROMIDE 10 MG/ML (PF) SYRINGE
PREFILLED_SYRINGE | INTRAVENOUS | Status: DC | PRN
  Administered 2022-04-11: 60 mg via INTRAVENOUS
  Administered 2022-04-11: 20 mg via INTRAVENOUS

## 2022-04-11 MED ORDER — LIDOCAINE 2% (20 MG/ML) 5 ML SYRINGE
INTRAMUSCULAR | Status: DC | PRN
  Administered 2022-04-11: 80 mg via INTRAVENOUS

## 2022-04-11 MED ORDER — SILVER NITRATE-POT NITRATE 75-25 % EX MISC
CUTANEOUS | Status: DC | PRN
  Administered 2022-04-11: 3 via TOPICAL

## 2022-04-11 MED ORDER — BUPIVACAINE HCL (PF) 0.25 % IJ SOLN
INTRAMUSCULAR | Status: AC
  Filled 2022-04-11: qty 30

## 2022-04-11 MED ORDER — MIDAZOLAM HCL 2 MG/2ML IJ SOLN
INTRAMUSCULAR | Status: AC
  Filled 2022-04-11: qty 2

## 2022-04-11 MED ORDER — PHENYLEPHRINE HCL-NACL 20-0.9 MG/250ML-% IV SOLN
INTRAVENOUS | Status: DC | PRN
  Administered 2022-04-11: 45 ug/min via INTRAVENOUS

## 2022-04-11 MED ORDER — HYDROMORPHONE HCL 1 MG/ML IJ SOLN: 0.2500 mg | INTRAMUSCULAR | Status: DC | PRN

## 2022-04-11 MED ORDER — OXYCODONE HCL 5 MG PO TABS: 5.0000 mg | ORAL_TABLET | Freq: Four times a day (QID) | ORAL | 0 refills | Status: AC | PRN

## 2022-04-11 MED ORDER — CHLORHEXIDINE GLUCONATE 0.12 % MT SOLN
15.0000 mL | Freq: Once | OROMUCOSAL | Status: AC
  Administered 2022-04-11: 15 mL via OROMUCOSAL
  Filled 2022-04-11: qty 15

## 2022-04-11 MED ORDER — SUGAMMADEX SODIUM 200 MG/2ML IV SOLN
INTRAVENOUS | Status: DC | PRN
  Administered 2022-04-11: 200 mg via INTRAVENOUS

## 2022-04-11 MED ORDER — DEXAMETHASONE SODIUM PHOSPHATE 10 MG/ML IJ SOLN
INTRAMUSCULAR | Status: DC | PRN
  Administered 2022-04-11: 10 mg via INTRAVENOUS

## 2022-04-11 MED ORDER — KETOROLAC TROMETHAMINE 30 MG/ML IJ SOLN: 30.0000 mg | Freq: Once | INTRAMUSCULAR | Status: DC | PRN

## 2022-04-11 MED ORDER — ORAL CARE MOUTH RINSE: 15.0000 mL | Freq: Once | OROMUCOSAL | Status: AC

## 2022-04-11 MED ORDER — SILVER NITRATE-POT NITRATE 75-25 % EX MISC
CUTANEOUS | Status: AC
  Filled 2022-04-11: qty 10

## 2022-04-11 MED ORDER — IBUPROFEN 800 MG PO TABS: 800.0000 mg | ORAL_TABLET | Freq: Three times a day (TID) | ORAL | 0 refills | Status: AC | PRN

## 2022-04-11 MED ORDER — PROPOFOL 500 MG/50ML IV EMUL
INTRAVENOUS | Status: DC | PRN
  Administered 2022-04-11: 20 ug/kg/min via INTRAVENOUS

## 2022-04-11 MED ORDER — PROPOFOL 10 MG/ML IV BOLUS
INTRAVENOUS | Status: DC | PRN
  Administered 2022-04-11: 150 mg via INTRAVENOUS

## 2022-04-11 MED ORDER — ONDANSETRON HCL 4 MG/2ML IJ SOLN
INTRAMUSCULAR | Status: DC | PRN
  Administered 2022-04-11: 4 mg via INTRAVENOUS

## 2022-04-11 MED ORDER — BUPIVACAINE HCL (PF) 0.25 % IJ SOLN
INTRAMUSCULAR | Status: DC | PRN
  Administered 2022-04-11: 30 mL

## 2022-04-11 SURGICAL SUPPLY — 44 items
APPLICATOR ARISTA FLEXITIP XL (MISCELLANEOUS) IMPLANT
CABLE HIGH FREQUENCY MONO STRZ (ELECTRODE) IMPLANT
CATH ROBINSON RED A/P 16FR (CATHETERS) ×4 IMPLANT
CHLORAPREP W/TINT 26 (MISCELLANEOUS) ×4 IMPLANT
DERMABOND ADVANCED .7 DNX12 (GAUZE/BANDAGES/DRESSINGS) ×4 IMPLANT
DILATOR CANAL MILEX (MISCELLANEOUS) IMPLANT
ELECT REM PT RETURN 9FT ADLT (ELECTROSURGICAL) ×4
ELECTRODE REM PT RTRN 9FT ADLT (ELECTROSURGICAL) ×4 IMPLANT
GAUZE 4X4 16PLY ~~LOC~~+RFID DBL (SPONGE) ×4 IMPLANT
GLOVE BIO SURGEON STRL SZ 6.5 (GLOVE) ×8 IMPLANT
GLOVE BIOGEL PI IND STRL 6.5 (GLOVE) ×4 IMPLANT
GLOVE BIOGEL PI IND STRL 7.0 (GLOVE) ×8 IMPLANT
GLOVE SURG UNDER POLY LF SZ7 (GLOVE) ×4 IMPLANT
GOWN STRL REUS W/ TWL LRG LVL3 (GOWN DISPOSABLE) ×8 IMPLANT
GOWN STRL REUS W/TWL LRG LVL3 (GOWN DISPOSABLE) ×12
HEMOSTAT ARISTA ABSORB 3G PWDR (HEMOSTASIS) IMPLANT
IRRIG SUCT STRYKERFLOW 2 WTIP (MISCELLANEOUS) ×4
IRRIGATION SUCT STRKRFLW 2 WTP (MISCELLANEOUS) IMPLANT
KIT PROCEDURE FLUENT (KITS) ×4 IMPLANT
KIT TURNOVER KIT B (KITS) ×4 IMPLANT
LIGASURE VESSEL 5MM BLUNT TIP (ELECTROSURGICAL) IMPLANT
LOOP CUTTING BIPOLAR 21FR (ELECTRODE) IMPLANT
NS IRRIG 1000ML POUR BTL (IV SOLUTION) ×4 IMPLANT
PACK LAPAROSCOPY BASIN (CUSTOM PROCEDURE TRAY) ×4 IMPLANT
PACK VAGINAL MINOR WOMEN LF (CUSTOM PROCEDURE TRAY) ×4 IMPLANT
PAD OB MATERNITY 4.3X12.25 (PERSONAL CARE ITEMS) ×4 IMPLANT
PROTECTOR NERVE ULNAR (MISCELLANEOUS) ×8 IMPLANT
SET IRRIG Y TYPE TUR BLADDER L (SET/KITS/TRAYS/PACK) IMPLANT
SET TUBE SMOKE EVAC HIGH FLOW (TUBING) ×4 IMPLANT
SHEARS HARMONIC ACE PLUS 36CM (ENDOMECHANICALS) IMPLANT
SLEEVE ADV FIXATION 5X100MM (TROCAR) ×4 IMPLANT
SOLUTION ELECTROLUBE (MISCELLANEOUS) IMPLANT
SUT VIC AB 4-0 PS2 27 (SUTURE) IMPLANT
SUT VICRYL 0 UR6 27IN ABS (SUTURE) ×4 IMPLANT
SUT VICRYL 4-0 PS2 18IN ABS (SUTURE) ×4 IMPLANT
SYR 20ML LL LF (SYRINGE) IMPLANT
SYS BAG RETRIEVAL 10MM (BASKET) ×4
SYSTEM BAG RETRIEVAL 10MM (BASKET) IMPLANT
TOWEL GREEN STERILE FF (TOWEL DISPOSABLE) ×8 IMPLANT
TRAP SPECIMEN MUCUS 40CC (MISCELLANEOUS) IMPLANT
TRAY FOLEY W/BAG SLVR 14FR (SET/KITS/TRAYS/PACK) ×4 IMPLANT
TROCAR ADV FIXATION 11X100MM (TROCAR) ×4 IMPLANT
TROCAR ADV FIXATION 5X100MM (TROCAR) ×4 IMPLANT
WARMER LAPAROSCOPE (MISCELLANEOUS) ×4 IMPLANT

## 2022-04-11 NOTE — Transfer of Care (Signed)
Immediate Anesthesia Transfer of Care Note  Patient: Glenda Lee  Procedure(s) Performed: LAPAROSCOPIC BILATERAL SALPINGECTOMY (Bilateral: Abdomen) LAPAROSCOPIC LEFT OOPHORECTOMY AND REMOVAL OF ADNEXAL MASS (Left: Abdomen) HYSTEROSCOPY REMOVAL OF RETAINED IUD (Cervix) CYSTOSCOPY (Bladder)  Patient Location: PACU  Anesthesia Type:General  Level of Consciousness: drowsy and patient cooperative  Airway & Oxygen Therapy: Patient Spontanous Breathing and Patient connected to nasal cannula oxygen  Post-op Assessment: Report given to RN, Post -op Vital signs reviewed and stable, and Patient moving all extremities X 4  Post vital signs: Reviewed and stable  Last Vitals:  Vitals Value Taken Time  BP 152/98 04/11/22 1030  Temp    Pulse 70 04/11/22 1032  Resp 12 04/11/22 1032  SpO2 98 % 04/11/22 1032  Vitals shown include unvalidated device data.  Last Pain:  Vitals:   04/11/22 0703  TempSrc:   PainSc: 0-No pain         Complications: No notable events documented.

## 2022-04-11 NOTE — Anesthesia Procedure Notes (Signed)
Procedure Name: Intubation Date/Time: 04/11/2022 9:04 AM  Performed by: Darletta Moll, CRNAPre-anesthesia Checklist: Patient identified, Emergency Drugs available, Suction available and Patient being monitored Patient Re-evaluated:Patient Re-evaluated prior to induction Oxygen Delivery Method: Circle system utilized Preoxygenation: Pre-oxygenation with 100% oxygen Induction Type: IV induction Ventilation: Mask ventilation without difficulty and Oral airway inserted - appropriate to patient size Laryngoscope Size: Glidescope and 3 Grade View: Grade I Tube type: Oral Tube size: 7.0 mm Number of attempts: 1 Airway Equipment and Method: Stylet and Oral airway Placement Confirmation: ETT inserted through vocal cords under direct vision, positive ETCO2 and breath sounds checked- equal and bilateral Secured at: 22 cm Tube secured with: Tape Dental Injury: Teeth and Oropharynx as per pre-operative assessment

## 2022-04-11 NOTE — Anesthesia Postprocedure Evaluation (Signed)
Anesthesia Post Note  Patient: Glenda Lee  Procedure(s) Performed: LAPAROSCOPIC BILATERAL SALPINGECTOMY (Bilateral: Abdomen) LAPAROSCOPIC LEFT OOPHORECTOMY AND REMOVAL OF ADNEXAL MASS (Left: Abdomen) HYSTEROSCOPY REMOVAL OF RETAINED IUD (Cervix) CYSTOSCOPY (Bladder)     Patient location during evaluation: PACU Anesthesia Type: General Level of consciousness: awake and alert Pain management: pain level controlled Vital Signs Assessment: post-procedure vital signs reviewed and stable Respiratory status: spontaneous breathing, nonlabored ventilation, respiratory function stable and patient connected to nasal cannula oxygen Cardiovascular status: blood pressure returned to baseline and stable Postop Assessment: no apparent nausea or vomiting Anesthetic complications: no  No notable events documented.  Last Vitals:  Vitals:   04/11/22 1100 04/11/22 1110  BP: (!) 164/92 (!) 141/95  Pulse: 62 62  Resp: (!) 22 14  Temp:  36.6 C  SpO2: 95% 97%    Last Pain:  Vitals:   04/11/22 1110  TempSrc:   PainSc: 0-No pain                 Barnet Glasgow

## 2022-04-11 NOTE — Interval H&P Note (Signed)
History and Physical Interval Note:  04/11/2022 8:16 AM  Glenda Lee  has presented today for surgery, with the diagnosis of Adnexal Mass and Lost IUD strings. The various methods of treatment have been discussed with the patient and family. After consideration of risks, benefits and other options for treatment, the patient has consented to  Procedure(s): LAPAROSCOPIC BILATERAL SALPINGECTOMY (Bilateral) LAPAROSCOPIC LEFT OOPHORECTOMY (Unilateral) HYSTEROSCOPY TO REMOVE RETAINED IUD (N/A) as a surgical intervention.  The patient's history has been reviewed, patient examined, no change in status, stable for surgery.  I have reviewed the patient's chart and labs.  Questions were answered to the patient's satisfaction.     [name]

## 2022-04-11 NOTE — Op Note (Addendum)
Pre Op Dx:   1. Left adnexal mass 2. Desires bilateral salpingectomy 3. Lost IUD strings  Post Op Dx:   1. Left tubal cyst 2. Desires bilateral salpingectomy 3. Lost IUD strings  Procedure:   1. Pelvic washings 2. Laparoscopic left salpingo-oophorectomy 3. Hysteroscopic removal of IUD 4. Cystoscopy  Surgeon:  Dr. Wellington Hampshire. Delora Fuel Assistants:  Dr. Christophe Louis Anesthesia:  General  EBL:  10cc  IVF:  700cc UOP:  400cc clear yellow urine Fluid deficit: 35cc  Drains:  None Specimen removed:  Left fallopian tube containing tubal cyst and left ovary - sent to pathology  Pelvic washings - sent to cytology Device(s) implanted:  None Case Type:  Clean-contaminated Findings: Normal-sized fibroid uterus. Normal-appearing right fallopian tube and ovary. Evidence of prior tubal ligation. Left fallopian tube containing ~7cm cyst with ovary adherent to the tube. Serous fluid within the tubal cyst. Unable to visualize left ureter laparoscopically. Normal-appearing liver contours. IUD strings visualized hysteroscopically. On cystoscopy, brisk bilateral ureteral jets were noted. Complications: None Indications:  52 y.o. G2P2 who has enlarging left adnexal mass with occasional pain who desired definitive removal as well as opportunistic bilateral salpingectomy. Also, desires removal of IUD and IUD strings are lost.  Description of Procedure: After informed consent the patient was taken to the operating room where general endotracheal anesthesia was administered and found to be adequate. She was placed in dorsal lithotomy position. She was prepped and draped in the usual sterile fashion. An operative timeout was performed. A Hulka tenaculum was placed in the cervix and her bladder was drained. The abdomen was entered through an intraumbilical incision using a 35m trocar under direct visualization and a pneumoperitoneum was established. Bilateral lower quadrant ports were placed under direct  visualization. The entry point was visualized from the inferior port and atraumatic entry confirmed. The findings as above were noted. Pelvic washings obtained. The ovary was noted to be adherent to the left fallopian tube containing the cyst and decision was made to proceed with salpingo-oophorectomy. The left infundibulopelvic ligament was ligated using the Ligasure device. Hemostasis noted. The right fallopian tube was divided at the mesosalpinx to the right cornua. Hemostasis noted. Due to the size of the cyst, a laparoscopic needle was used to puncture and drain the cyst using a syringe. Clear, serous fluid was noted from the cyst. The specimens were removed using an endocatch bag and passed off the field. Adequate hemostasis was noted. The pneumoperitoneum was reduced and ports withdrawn. The fascia at the [id number] port site was closed in an open technique using 0-Vicryl. The skin was closed with 4-0 Vicryl in subcuticular fashion.   Attention turned to the hysteroscopic portion of the procedure. Foley catheter removed. The Hulka tenaculum was removed. The anterior lip of the cervix was grasped with a single-tooth tenaculum and the cervix was serially dilated to accommodate the hysteroscope.  The hysteroscope was advanced and the findings as above was noted. The IUD strings were visualized and grasped with a hysteroscopic grasper and the IUD was removed in its entirety. The single-tooth tenaculum was removed and its sites were made hemostatic with pressure and silver nitrate.  Adequate hemostasis was noted.    Cystoscopy was performed and demonstrated intact urothelium throughout the bladder, a normal-appearing trigone and bilaterally patent ureteral orifices with normal urine jets noted. The patient was awakened and extubated and appeared to have tolerated the procedure well.  All counts were correct.  Disposition:  PACU  MDrema Dallas DO

## 2022-04-12 ENCOUNTER — Encounter (HOSPITAL_COMMUNITY): Payer: Self-pay | Admitting: Obstetrics and Gynecology

## 2022-04-12 LAB — SURGICAL PATHOLOGY

## 2022-04-14 LAB — CYTOLOGY - NON PAP

## 2022-04-20 ENCOUNTER — Other Ambulatory Visit: Payer: 59

## 2022-05-04 ENCOUNTER — Other Ambulatory Visit: Payer: 59

## 2022-05-04 DIAGNOSIS — D369 Benign neoplasm, unspecified site: Secondary | ICD-10-CM | POA: Diagnosis not present

## 2022-05-04 DIAGNOSIS — D259 Leiomyoma of uterus, unspecified: Secondary | ICD-10-CM | POA: Diagnosis not present

## 2022-08-17 DIAGNOSIS — E782 Mixed hyperlipidemia: Secondary | ICD-10-CM | POA: Diagnosis not present

## 2022-08-17 DIAGNOSIS — I10 Essential (primary) hypertension: Secondary | ICD-10-CM | POA: Diagnosis not present

## 2022-08-17 DIAGNOSIS — Z113 Encounter for screening for infections with a predominantly sexual mode of transmission: Secondary | ICD-10-CM | POA: Diagnosis not present

## 2022-08-17 DIAGNOSIS — R7303 Prediabetes: Secondary | ICD-10-CM | POA: Diagnosis not present

## 2022-08-17 DIAGNOSIS — J302 Other seasonal allergic rhinitis: Secondary | ICD-10-CM | POA: Diagnosis not present

## 2022-09-07 DIAGNOSIS — E119 Type 2 diabetes mellitus without complications: Secondary | ICD-10-CM | POA: Diagnosis not present

## 2022-09-07 DIAGNOSIS — I10 Essential (primary) hypertension: Secondary | ICD-10-CM | POA: Diagnosis not present

## 2022-09-07 DIAGNOSIS — J302 Other seasonal allergic rhinitis: Secondary | ICD-10-CM | POA: Diagnosis not present

## 2022-09-07 DIAGNOSIS — E782 Mixed hyperlipidemia: Secondary | ICD-10-CM | POA: Diagnosis not present

## 2022-10-04 ENCOUNTER — Inpatient Hospital Stay: Admission: RE | Admit: 2022-10-04 | Payer: 59 | Source: Ambulatory Visit

## 2022-11-24 DIAGNOSIS — I10 Essential (primary) hypertension: Secondary | ICD-10-CM | POA: Diagnosis not present

## 2022-12-03 DIAGNOSIS — Z1231 Encounter for screening mammogram for malignant neoplasm of breast: Secondary | ICD-10-CM | POA: Diagnosis not present

## 2022-12-03 DIAGNOSIS — I10 Essential (primary) hypertension: Secondary | ICD-10-CM | POA: Diagnosis not present

## 2022-12-03 DIAGNOSIS — Z800 Family history of malignant neoplasm of digestive organs: Secondary | ICD-10-CM | POA: Diagnosis not present

## 2022-12-04 ENCOUNTER — Other Ambulatory Visit: Payer: Self-pay | Admitting: Physician Assistant

## 2022-12-04 DIAGNOSIS — Z1231 Encounter for screening mammogram for malignant neoplasm of breast: Secondary | ICD-10-CM

## 2023-01-02 ENCOUNTER — Other Ambulatory Visit (HOSPITAL_COMMUNITY): Payer: Self-pay

## 2023-01-02 ENCOUNTER — Other Ambulatory Visit (HOSPITAL_BASED_OUTPATIENT_CLINIC_OR_DEPARTMENT_OTHER): Payer: Self-pay

## 2023-01-02 MED ORDER — WEGOVY 0.25 MG/0.5ML ~~LOC~~ SOAJ
0.2500 mg | SUBCUTANEOUS | 0 refills | Status: AC
  Filled 2023-01-02: qty 2, 28d supply, fill #0

## 2023-01-03 ENCOUNTER — Other Ambulatory Visit (HOSPITAL_BASED_OUTPATIENT_CLINIC_OR_DEPARTMENT_OTHER): Payer: Self-pay

## 2023-01-04 ENCOUNTER — Other Ambulatory Visit (HOSPITAL_BASED_OUTPATIENT_CLINIC_OR_DEPARTMENT_OTHER): Payer: Self-pay

## 2023-01-04 ENCOUNTER — Other Ambulatory Visit (HOSPITAL_COMMUNITY): Payer: Self-pay

## 2023-01-14 ENCOUNTER — Other Ambulatory Visit (HOSPITAL_BASED_OUTPATIENT_CLINIC_OR_DEPARTMENT_OTHER): Payer: Self-pay

## 2023-01-14 ENCOUNTER — Ambulatory Visit
Admission: RE | Admit: 2023-01-14 | Discharge: 2023-01-14 | Disposition: A | Discharge status: Home / Self Care | Payer: 59 | Source: Ambulatory Visit | Attending: Physician Assistant | Admitting: Physician Assistant

## 2023-01-14 DIAGNOSIS — Z1231 Encounter for screening mammogram for malignant neoplasm of breast: Secondary | ICD-10-CM

## 2023-02-07 DIAGNOSIS — I10 Essential (primary) hypertension: Secondary | ICD-10-CM | POA: Diagnosis not present

## 2023-02-07 DIAGNOSIS — Z0000 Encounter for general adult medical examination without abnormal findings: Secondary | ICD-10-CM | POA: Diagnosis not present

## 2023-02-07 DIAGNOSIS — J302 Other seasonal allergic rhinitis: Secondary | ICD-10-CM | POA: Diagnosis not present

## 2023-02-07 DIAGNOSIS — E119 Type 2 diabetes mellitus without complications: Secondary | ICD-10-CM | POA: Diagnosis not present

## 2023-02-07 DIAGNOSIS — E782 Mixed hyperlipidemia: Secondary | ICD-10-CM | POA: Diagnosis not present

## 2023-02-07 DIAGNOSIS — F419 Anxiety disorder, unspecified: Secondary | ICD-10-CM | POA: Diagnosis not present

## 2023-03-07 ENCOUNTER — Other Ambulatory Visit: Payer: Self-pay | Admitting: Family Medicine

## 2023-03-07 DIAGNOSIS — Z1231 Encounter for screening mammogram for malignant neoplasm of breast: Secondary | ICD-10-CM

## 2023-03-28 DIAGNOSIS — E041 Nontoxic single thyroid nodule: Secondary | ICD-10-CM | POA: Diagnosis not present

## 2023-03-28 DIAGNOSIS — H524 Presbyopia: Secondary | ICD-10-CM | POA: Diagnosis not present

## 2023-03-28 DIAGNOSIS — H5213 Myopia, bilateral: Secondary | ICD-10-CM | POA: Diagnosis not present

## 2023-03-28 DIAGNOSIS — E782 Mixed hyperlipidemia: Secondary | ICD-10-CM | POA: Diagnosis not present

## 2023-03-28 DIAGNOSIS — Z0100 Encounter for examination of eyes and vision without abnormal findings: Secondary | ICD-10-CM | POA: Diagnosis not present

## 2023-03-28 DIAGNOSIS — E119 Type 2 diabetes mellitus without complications: Secondary | ICD-10-CM | POA: Diagnosis not present

## 2023-03-28 DIAGNOSIS — F419 Anxiety disorder, unspecified: Secondary | ICD-10-CM | POA: Diagnosis not present

## 2023-03-28 DIAGNOSIS — J302 Other seasonal allergic rhinitis: Secondary | ICD-10-CM | POA: Diagnosis not present

## 2023-03-28 DIAGNOSIS — I10 Essential (primary) hypertension: Secondary | ICD-10-CM | POA: Diagnosis not present

## 2023-05-07 DIAGNOSIS — I10 Essential (primary) hypertension: Secondary | ICD-10-CM | POA: Diagnosis not present

## 2023-05-07 DIAGNOSIS — E119 Type 2 diabetes mellitus without complications: Secondary | ICD-10-CM | POA: Diagnosis not present

## 2023-05-07 DIAGNOSIS — J302 Other seasonal allergic rhinitis: Secondary | ICD-10-CM | POA: Diagnosis not present

## 2023-05-07 DIAGNOSIS — F419 Anxiety disorder, unspecified: Secondary | ICD-10-CM | POA: Diagnosis not present

## 2023-05-07 DIAGNOSIS — E782 Mixed hyperlipidemia: Secondary | ICD-10-CM | POA: Diagnosis not present

## 2023-05-09 ENCOUNTER — Other Ambulatory Visit: Payer: Self-pay | Admitting: Otolaryngology

## 2023-05-09 DIAGNOSIS — E041 Nontoxic single thyroid nodule: Secondary | ICD-10-CM

## 2023-05-16 ENCOUNTER — Ambulatory Visit
Admission: RE | Admit: 2023-05-16 | Discharge: 2023-05-16 | Disposition: A | Discharge status: Home / Self Care | Source: Ambulatory Visit | Attending: Otolaryngology | Admitting: Otolaryngology

## 2023-05-16 DIAGNOSIS — E041 Nontoxic single thyroid nodule: Secondary | ICD-10-CM | POA: Diagnosis not present

## 2023-06-06 DIAGNOSIS — F419 Anxiety disorder, unspecified: Secondary | ICD-10-CM | POA: Diagnosis not present

## 2023-06-06 DIAGNOSIS — I10 Essential (primary) hypertension: Secondary | ICD-10-CM | POA: Diagnosis not present

## 2023-06-06 DIAGNOSIS — J302 Other seasonal allergic rhinitis: Secondary | ICD-10-CM | POA: Diagnosis not present

## 2023-06-06 DIAGNOSIS — E119 Type 2 diabetes mellitus without complications: Secondary | ICD-10-CM | POA: Diagnosis not present

## 2023-06-06 DIAGNOSIS — E782 Mixed hyperlipidemia: Secondary | ICD-10-CM | POA: Diagnosis not present

## 2023-07-22 DIAGNOSIS — I10 Essential (primary) hypertension: Secondary | ICD-10-CM | POA: Diagnosis not present

## 2023-07-22 DIAGNOSIS — F419 Anxiety disorder, unspecified: Secondary | ICD-10-CM | POA: Diagnosis not present

## 2023-07-22 DIAGNOSIS — E782 Mixed hyperlipidemia: Secondary | ICD-10-CM | POA: Diagnosis not present

## 2023-07-22 DIAGNOSIS — J302 Other seasonal allergic rhinitis: Secondary | ICD-10-CM | POA: Diagnosis not present

## 2023-07-22 DIAGNOSIS — E041 Nontoxic single thyroid nodule: Secondary | ICD-10-CM | POA: Diagnosis not present

## 2023-07-22 DIAGNOSIS — E119 Type 2 diabetes mellitus without complications: Secondary | ICD-10-CM | POA: Diagnosis not present

## 2023-08-01 ENCOUNTER — Other Ambulatory Visit: Payer: Self-pay | Admitting: Otolaryngology

## 2023-08-01 DIAGNOSIS — E041 Nontoxic single thyroid nodule: Secondary | ICD-10-CM

## 2024-01-31 ENCOUNTER — Inpatient Hospital Stay: Admission: RE | Admit: 2024-01-31 | Discharge: 2024-01-31 | Attending: Family Medicine

## 2024-01-31 DIAGNOSIS — Z1231 Encounter for screening mammogram for malignant neoplasm of breast: Secondary | ICD-10-CM
